# Patient Record
Sex: Female | Born: 1955 | Race: White | Hispanic: No | Marital: Married | Smoking: Never smoker
Health system: Southern US, Community
[De-identification: ages and names within clinical notes are randomized; demographics above are authoritative.]

## PROBLEM LIST (undated history)

## (undated) DIAGNOSIS — G8929 Other chronic pain: Secondary | ICD-10-CM

## (undated) DIAGNOSIS — R51 Headache: Secondary | ICD-10-CM

## (undated) DIAGNOSIS — K219 Gastro-esophageal reflux disease without esophagitis: Secondary | ICD-10-CM

## (undated) DIAGNOSIS — G629 Polyneuropathy, unspecified: Secondary | ICD-10-CM

## (undated) DIAGNOSIS — E559 Vitamin D deficiency, unspecified: Secondary | ICD-10-CM

## (undated) DIAGNOSIS — F329 Major depressive disorder, single episode, unspecified: Secondary | ICD-10-CM

## (undated) DIAGNOSIS — J302 Other seasonal allergic rhinitis: Secondary | ICD-10-CM

## (undated) DIAGNOSIS — Z8709 Personal history of other diseases of the respiratory system: Secondary | ICD-10-CM

## (undated) DIAGNOSIS — R112 Nausea with vomiting, unspecified: Secondary | ICD-10-CM

## (undated) DIAGNOSIS — M2550 Pain in unspecified joint: Secondary | ICD-10-CM

## (undated) DIAGNOSIS — I10 Essential (primary) hypertension: Secondary | ICD-10-CM

## (undated) DIAGNOSIS — Z8669 Personal history of other diseases of the nervous system and sense organs: Secondary | ICD-10-CM

## (undated) DIAGNOSIS — Z8619 Personal history of other infectious and parasitic diseases: Secondary | ICD-10-CM

## (undated) DIAGNOSIS — K259 Gastric ulcer, unspecified as acute or chronic, without hemorrhage or perforation: Secondary | ICD-10-CM

## (undated) DIAGNOSIS — Q438 Other specified congenital malformations of intestine: Secondary | ICD-10-CM

## (undated) DIAGNOSIS — M549 Dorsalgia, unspecified: Secondary | ICD-10-CM

## (undated) DIAGNOSIS — G4700 Insomnia, unspecified: Secondary | ICD-10-CM

## (undated) DIAGNOSIS — Z87440 Personal history of urinary (tract) infections: Secondary | ICD-10-CM

## (undated) DIAGNOSIS — M2540 Effusion, unspecified joint: Secondary | ICD-10-CM

## (undated) DIAGNOSIS — K5790 Diverticulosis of intestine, part unspecified, without perforation or abscess without bleeding: Secondary | ICD-10-CM

## (undated) DIAGNOSIS — Z9889 Other specified postprocedural states: Secondary | ICD-10-CM

## (undated) DIAGNOSIS — F32A Depression, unspecified: Secondary | ICD-10-CM

## (undated) DIAGNOSIS — T300 Burn of unspecified body region, unspecified degree: Secondary | ICD-10-CM

## (undated) DIAGNOSIS — E785 Hyperlipidemia, unspecified: Secondary | ICD-10-CM

## (undated) DIAGNOSIS — M1990 Unspecified osteoarthritis, unspecified site: Secondary | ICD-10-CM

## (undated) DIAGNOSIS — K5900 Constipation, unspecified: Secondary | ICD-10-CM

## (undated) DIAGNOSIS — J45909 Unspecified asthma, uncomplicated: Secondary | ICD-10-CM

## (undated) DIAGNOSIS — J189 Pneumonia, unspecified organism: Secondary | ICD-10-CM

## (undated) DIAGNOSIS — M8580 Other specified disorders of bone density and structure, unspecified site: Secondary | ICD-10-CM

## (undated) HISTORY — PX: OTHER SURGICAL HISTORY: SHX169

## (undated) HISTORY — PX: ESOPHAGOGASTRODUODENOSCOPY: SHX1529

## (undated) HISTORY — PX: COLONOSCOPY: SHX174

---

## 1996-05-03 HISTORY — PX: ABDOMINAL HYSTERECTOMY: SHX81

## 1999-10-21 ENCOUNTER — Encounter: Payer: Self-pay | Admitting: Family Medicine

## 1999-10-21 ENCOUNTER — Ambulatory Visit (HOSPITAL_COMMUNITY): Admission: RE | Admit: 1999-10-21 | Discharge: 1999-10-21 | Payer: Self-pay | Admitting: Family Medicine

## 2000-08-18 ENCOUNTER — Encounter: Admission: RE | Admit: 2000-08-18 | Discharge: 2000-08-18 | Payer: Self-pay | Admitting: Family Medicine

## 2000-08-18 ENCOUNTER — Encounter: Payer: Self-pay | Admitting: Family Medicine

## 2001-09-13 ENCOUNTER — Encounter: Payer: Self-pay | Admitting: Family Medicine

## 2001-09-13 ENCOUNTER — Ambulatory Visit (HOSPITAL_COMMUNITY): Admission: RE | Admit: 2001-09-13 | Discharge: 2001-09-13 | Payer: Self-pay | Admitting: Family Medicine

## 2003-02-17 ENCOUNTER — Emergency Department (HOSPITAL_COMMUNITY): Admission: EM | Admit: 2003-02-17 | Discharge: 2003-02-17 | Payer: Self-pay | Admitting: Emergency Medicine

## 2003-02-17 ENCOUNTER — Encounter: Payer: Self-pay | Admitting: Emergency Medicine

## 2003-08-28 ENCOUNTER — Ambulatory Visit (HOSPITAL_COMMUNITY): Admission: RE | Admit: 2003-08-28 | Discharge: 2003-08-28 | Payer: Self-pay | Admitting: Family Medicine

## 2004-03-16 ENCOUNTER — Ambulatory Visit: Payer: Self-pay | Admitting: Family Medicine

## 2004-10-13 ENCOUNTER — Ambulatory Visit (HOSPITAL_COMMUNITY): Admission: RE | Admit: 2004-10-13 | Discharge: 2004-10-13 | Payer: Self-pay | Admitting: Internal Medicine

## 2005-01-19 ENCOUNTER — Ambulatory Visit: Payer: Self-pay | Admitting: Family Medicine

## 2005-03-04 ENCOUNTER — Ambulatory Visit: Payer: Self-pay | Admitting: Family Medicine

## 2005-03-10 ENCOUNTER — Ambulatory Visit: Payer: Self-pay

## 2005-03-11 ENCOUNTER — Ambulatory Visit: Payer: Self-pay

## 2005-03-22 ENCOUNTER — Ambulatory Visit: Payer: Self-pay | Admitting: Family Medicine

## 2005-04-12 ENCOUNTER — Ambulatory Visit: Payer: Self-pay | Admitting: Family Medicine

## 2005-04-12 ENCOUNTER — Other Ambulatory Visit: Admission: RE | Admit: 2005-04-12 | Discharge: 2005-04-12 | Payer: Self-pay | Admitting: Family Medicine

## 2005-04-12 ENCOUNTER — Encounter: Payer: Self-pay | Admitting: Family Medicine

## 2005-04-19 ENCOUNTER — Ambulatory Visit: Payer: Self-pay | Admitting: Gastroenterology

## 2005-05-17 ENCOUNTER — Encounter: Admission: RE | Admit: 2005-05-17 | Discharge: 2005-08-15 | Payer: Self-pay | Admitting: Family Medicine

## 2005-05-27 ENCOUNTER — Ambulatory Visit: Payer: Self-pay | Admitting: Gastroenterology

## 2005-05-27 ENCOUNTER — Ambulatory Visit: Payer: Self-pay | Admitting: Family Medicine

## 2005-06-04 ENCOUNTER — Ambulatory Visit: Payer: Self-pay | Admitting: Gastroenterology

## 2005-06-14 ENCOUNTER — Ambulatory Visit (HOSPITAL_COMMUNITY): Admission: RE | Admit: 2005-06-14 | Discharge: 2005-06-14 | Payer: Self-pay | Admitting: Gastroenterology

## 2005-07-30 ENCOUNTER — Ambulatory Visit: Payer: Self-pay | Admitting: Family Medicine

## 2005-08-12 ENCOUNTER — Ambulatory Visit: Payer: Self-pay | Admitting: Family Medicine

## 2005-10-15 ENCOUNTER — Ambulatory Visit (HOSPITAL_COMMUNITY): Admission: RE | Admit: 2005-10-15 | Discharge: 2005-10-15 | Payer: Self-pay | Admitting: Family Medicine

## 2006-10-27 IMAGING — RF DG BE W/ CM - WO/W KUB
19 of 24 series · 19 of 24 positions shown · non-contrast
Comparison: none

CLINICAL DATA: Patient had incomplete colonoscopy.  Patient has had constipation.
 BARIUM ENEMA:

[Series 1: run · 1 of 1 slices shown (1 of 19)]
[im 1/1]
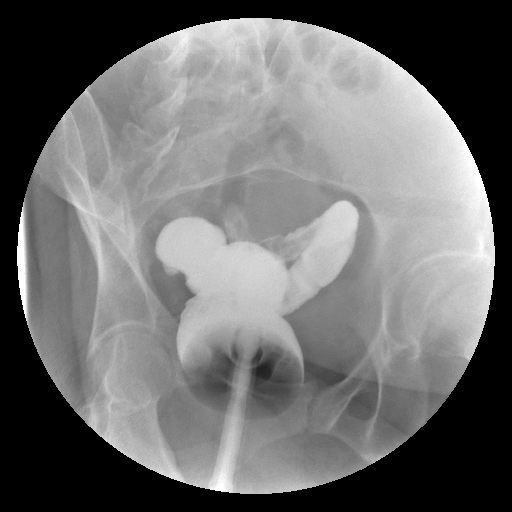

[Series 2: run · 1 of 1 slices shown (2 of 19)]
[im 1/1]
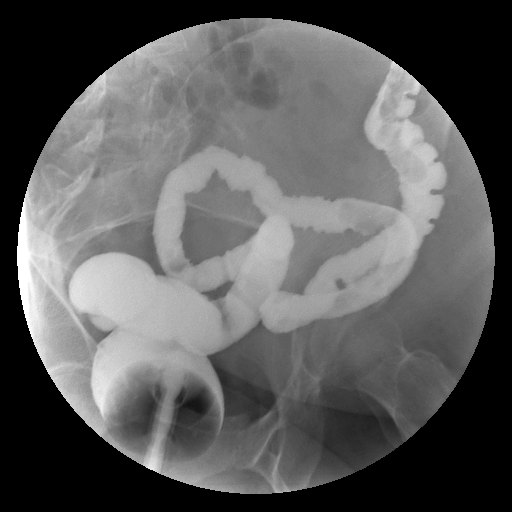

[Series 4: run · 1 of 1 slices shown (3 of 19)]
[im 1/1]
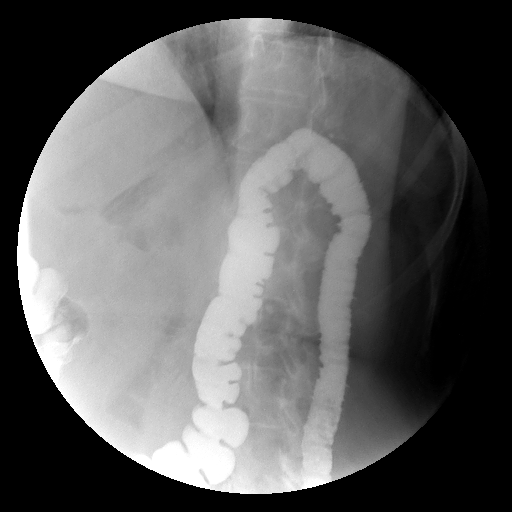

[Series 5: run · 1 of 1 slices shown (4 of 19)]
[im 1/1]
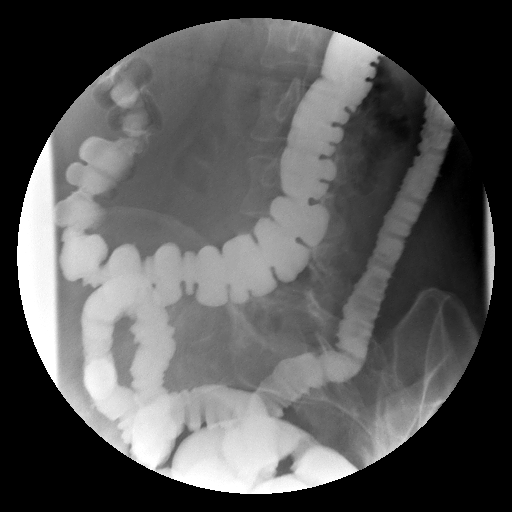

[Series 6: run · 1 of 1 slices shown (5 of 19)]
[im 1/1]
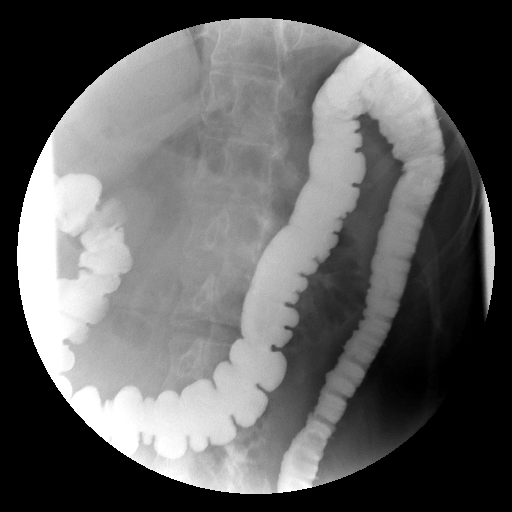

[Series 7: run · 1 of 1 slices shown (6 of 19)]
[im 1/1]
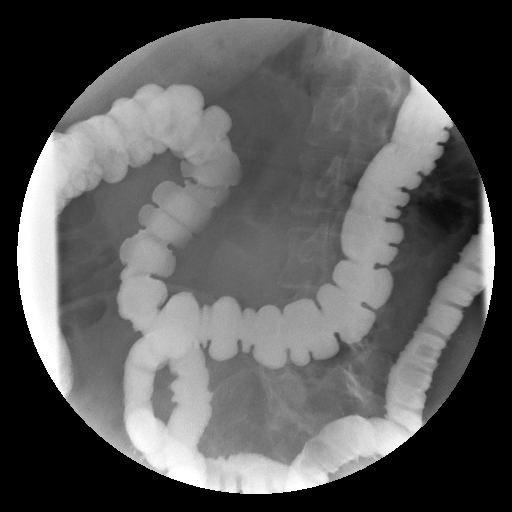

[Series 9: run · 1 of 1 slices shown (7 of 19)]
[im 1/1]
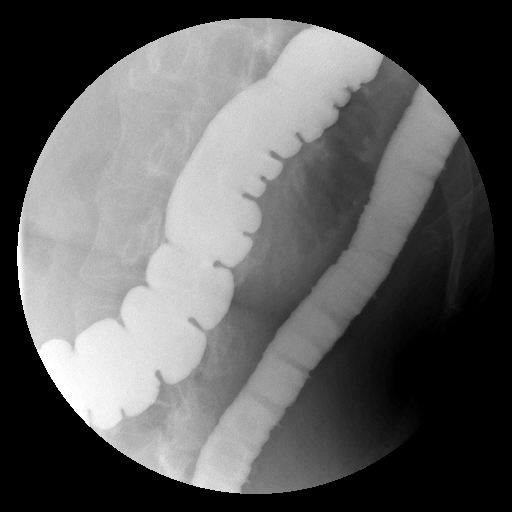

[Series 10: run · 1 of 1 slices shown (8 of 19)]
[im 1/1]
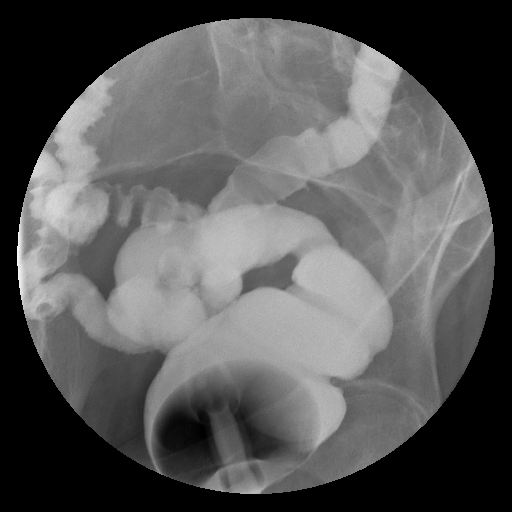

[Series 11: run · 1 of 1 slices shown (9 of 19)]
[im 1/1]
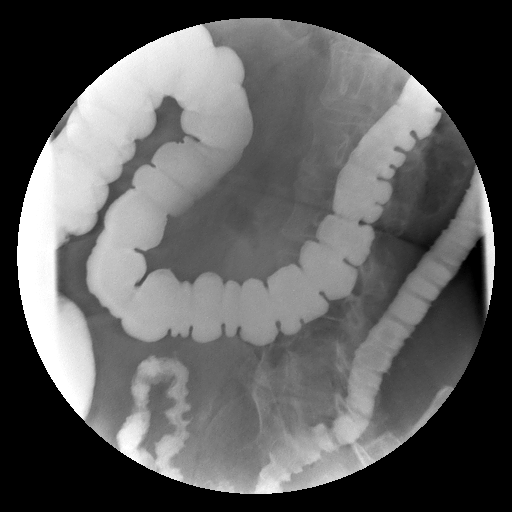

[Series 13: run · 1 of 1 slices shown (10 of 19)]
[im 1/1]
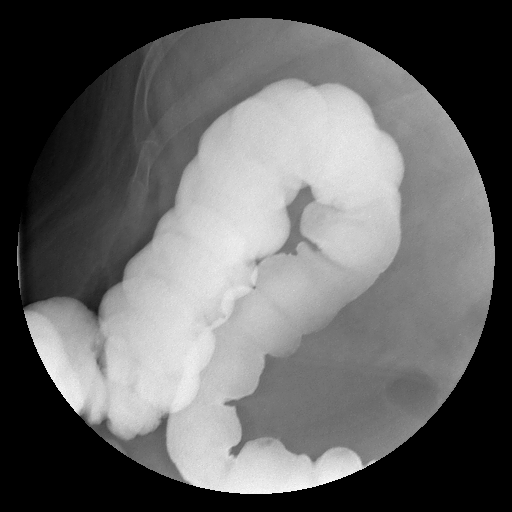

[Series 14: run · 1 of 1 slices shown (11 of 19)]
[im 1/1]
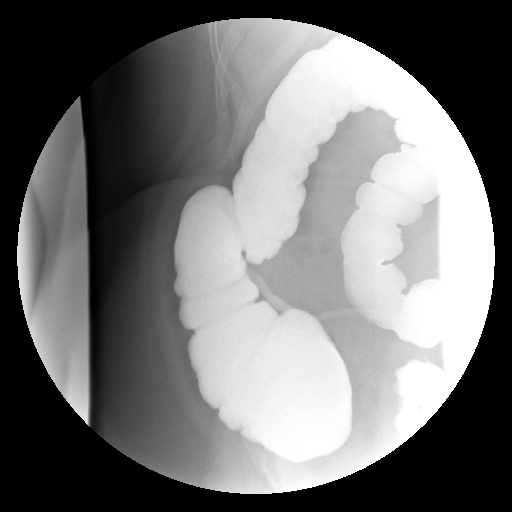

[Series 15: run · 1 of 1 slices shown (12 of 19)]
[im 1/1]
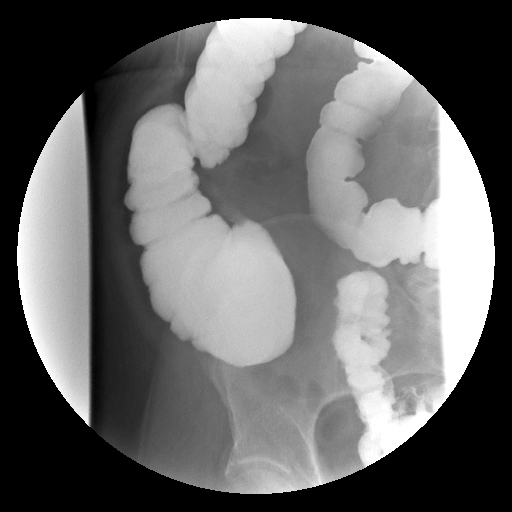

[Series 16: run · 1 of 1 slices shown (13 of 19)]
[im 1/1]
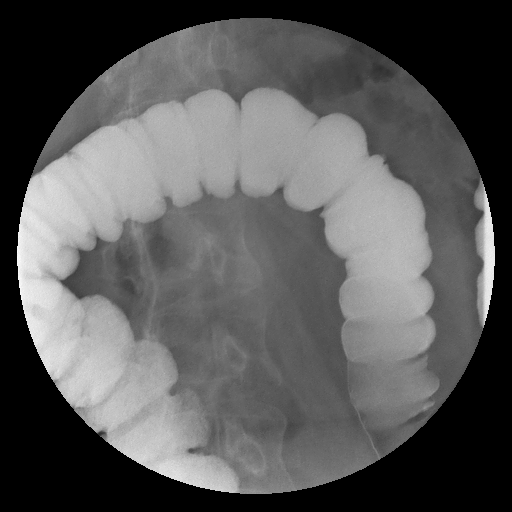

[Series 18: run · 1 of 1 slices shown (14 of 19)]
[im 1/1]
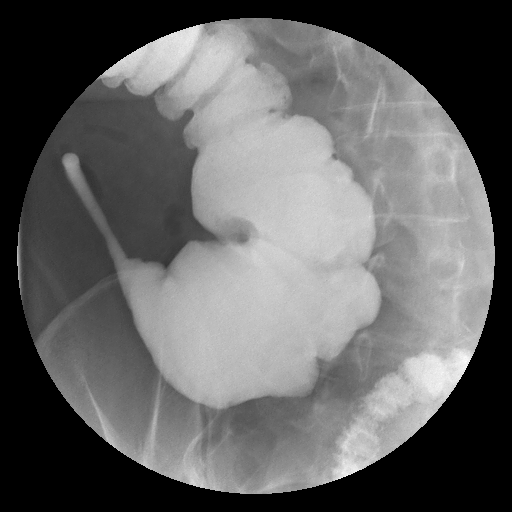

[Series 19: run · 1 of 1 slices shown (15 of 19)]
[im 1/1]
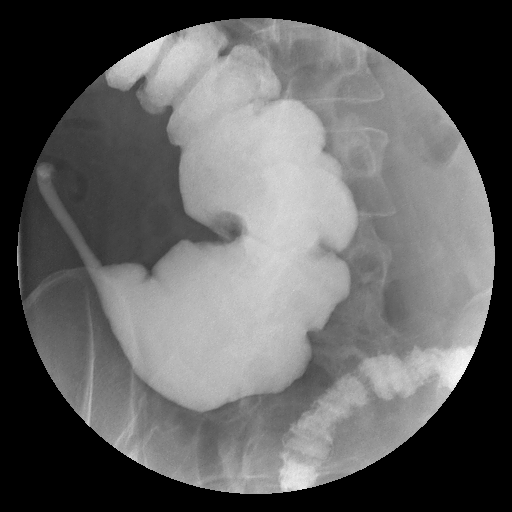

[Series 20: run · 1 of 1 slices shown (16 of 19)]
[im 1/1]
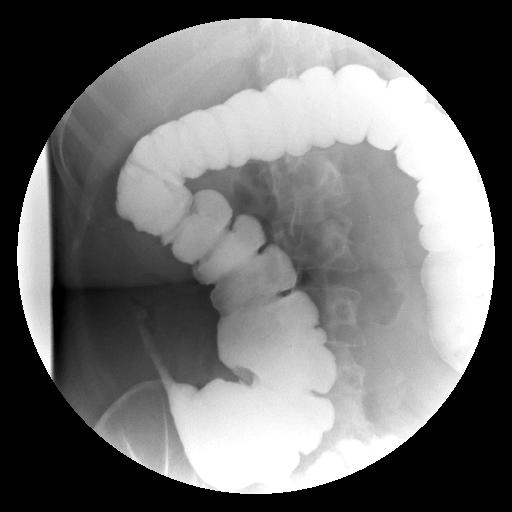

[Series 21: run · 1 of 1 slices shown (17 of 19)]
[im 1/1]
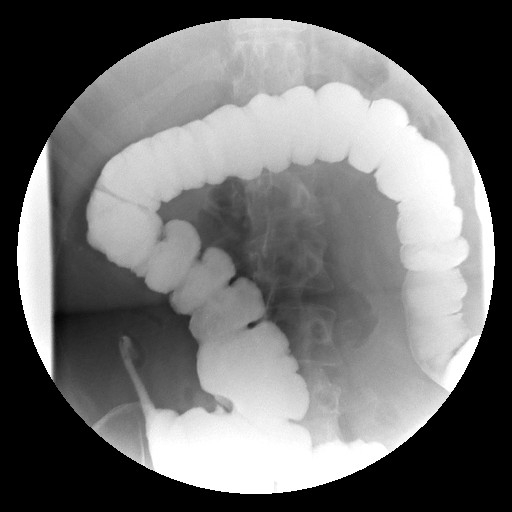

[Series 23: run · 1 of 1 slices shown (18 of 19)]
[im 1/1]
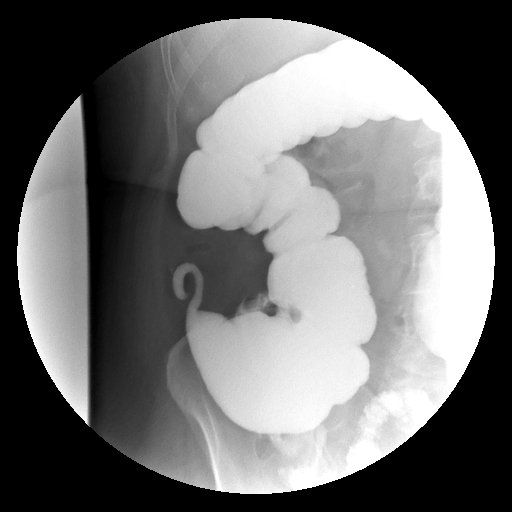

[Series 24: run · 1 of 1 slices shown (19 of 19)]
[im 1/1]
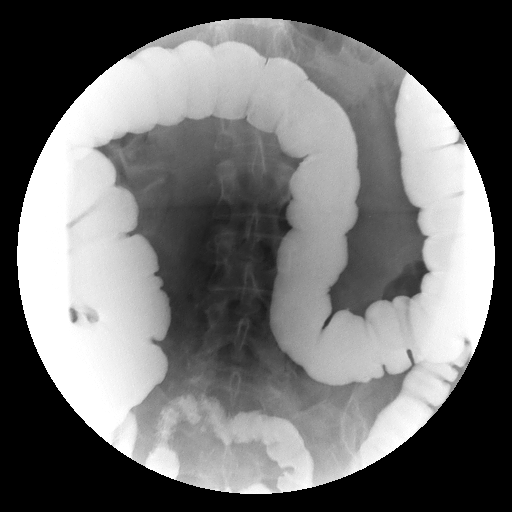

[19 of 24 positions shown; findings below may reference images not displayed]

FINDINGS: Preliminary KUB is unremarkable.  
 No obstruction is noted to the retrograde flow of barium.  Cecum is markedly tortuous as well as tortuosity in the region of the proximal and right colon.  There is a prominent mucosal fold noted in the region of the distal right colon in the area of the hepatic flexure.  This persists with the patient in the right posterior oblique as well as the left posterior position.  No definite mass is identified.  The cecum is well visualized with the cecum being malrotated with the ileocecal valve being laterally positioned.  Appendix is lateral in position and the tip of the appendix extends to the region of the liver.  There is noted to be retained feces right at the proximal right colon.
IMPRESSION: There is evidence of mobile cecum with ileocecal valve being positioned laterally and a type of retrocecal appendix.
 No abnormality noted, however, there is a prominent mucosal fold in the distal portion of the right colon, approximately at the hepatic flexure.  This mucosal fold is persistent and is not felt to represent a definite abnormality.

## 2007-08-15 ENCOUNTER — Ambulatory Visit (HOSPITAL_COMMUNITY): Admission: RE | Admit: 2007-08-15 | Discharge: 2007-08-15 | Payer: Self-pay | Admitting: Family Medicine

## 2008-05-28 ENCOUNTER — Encounter: Admission: RE | Admit: 2008-05-28 | Discharge: 2008-08-26 | Payer: Self-pay | Admitting: Psychiatry

## 2008-08-05 ENCOUNTER — Encounter: Admission: RE | Admit: 2008-08-05 | Discharge: 2008-08-13 | Payer: Self-pay | Admitting: Family Medicine

## 2008-08-15 ENCOUNTER — Ambulatory Visit (HOSPITAL_COMMUNITY): Admission: RE | Admit: 2008-08-15 | Discharge: 2008-08-15 | Payer: Self-pay | Admitting: Family Medicine

## 2010-03-22 ENCOUNTER — Emergency Department (HOSPITAL_BASED_OUTPATIENT_CLINIC_OR_DEPARTMENT_OTHER): Admission: EM | Admit: 2010-03-22 | Discharge: 2010-03-22 | Payer: Self-pay | Admitting: Emergency Medicine

## 2010-05-03 HISTORY — PX: OTHER SURGICAL HISTORY: SHX169

## 2010-06-11 ENCOUNTER — Other Ambulatory Visit: Payer: Self-pay | Admitting: Orthopedic Surgery

## 2010-06-11 DIAGNOSIS — M25511 Pain in right shoulder: Secondary | ICD-10-CM

## 2010-06-12 ENCOUNTER — Ambulatory Visit
Admission: RE | Admit: 2010-06-12 | Discharge: 2010-06-12 | Disposition: A | Discharge status: Home / Self Care | Payer: BC Managed Care – PPO | Source: Ambulatory Visit | Attending: Orthopedic Surgery | Admitting: Orthopedic Surgery

## 2010-06-12 DIAGNOSIS — M25511 Pain in right shoulder: Secondary | ICD-10-CM

## 2010-10-10 ENCOUNTER — Encounter: Payer: Self-pay | Admitting: Gastroenterology

## 2011-05-04 HISTORY — PX: OTHER SURGICAL HISTORY: SHX169

## 2011-08-06 ENCOUNTER — Other Ambulatory Visit: Payer: Self-pay | Admitting: Orthopaedic Surgery

## 2011-08-06 DIAGNOSIS — M25511 Pain in right shoulder: Secondary | ICD-10-CM

## 2011-08-09 ENCOUNTER — Ambulatory Visit
Admission: RE | Admit: 2011-08-09 | Discharge: 2011-08-09 | Disposition: A | Discharge status: Home / Self Care | Payer: BC Managed Care – PPO | Source: Ambulatory Visit | Attending: Orthopaedic Surgery | Admitting: Orthopaedic Surgery

## 2011-08-09 DIAGNOSIS — M25511 Pain in right shoulder: Secondary | ICD-10-CM

## 2011-08-09 MED ORDER — IOHEXOL 180 MG/ML  SOLN
15.0000 mL | Freq: Once | INTRAMUSCULAR | Status: AC | PRN
  Administered 2011-08-09: 15 mL via INTRA_ARTICULAR

## 2012-01-14 ENCOUNTER — Encounter (HOSPITAL_COMMUNITY): Payer: Self-pay | Admitting: Pharmacy Technician

## 2012-01-17 ENCOUNTER — Encounter (HOSPITAL_COMMUNITY)
Admission: RE | Admit: 2012-01-17 | Discharge: 2012-01-17 | Disposition: A | Discharge status: Home / Self Care | Payer: BC Managed Care – PPO | Source: Ambulatory Visit | Attending: Orthopedic Surgery | Admitting: Orthopedic Surgery

## 2012-01-17 ENCOUNTER — Encounter: Payer: Self-pay | Admitting: Gastroenterology

## 2012-01-17 ENCOUNTER — Ambulatory Visit (HOSPITAL_COMMUNITY)
Admission: RE | Admit: 2012-01-17 | Discharge: 2012-01-17 | Disposition: A | Discharge status: Home / Self Care | Payer: BC Managed Care – PPO | Source: Ambulatory Visit | Attending: Orthopedic Surgery | Admitting: Orthopedic Surgery

## 2012-01-17 ENCOUNTER — Ambulatory Visit
Admission: RE | Admit: 2012-01-17 | Discharge: 2012-01-17 | Disposition: A | Discharge status: Home / Self Care | Payer: BC Managed Care – PPO | Source: Ambulatory Visit | Attending: Orthopedic Surgery | Admitting: Orthopedic Surgery

## 2012-01-17 ENCOUNTER — Other Ambulatory Visit: Payer: Self-pay | Admitting: Orthopedic Surgery

## 2012-01-17 ENCOUNTER — Encounter (HOSPITAL_COMMUNITY): Payer: Self-pay

## 2012-01-17 ENCOUNTER — Other Ambulatory Visit (HOSPITAL_COMMUNITY): Payer: Self-pay | Admitting: Orthopedic Surgery

## 2012-01-17 DIAGNOSIS — Z01818 Encounter for other preprocedural examination: Secondary | ICD-10-CM | POA: Insufficient documentation

## 2012-01-17 DIAGNOSIS — Z01812 Encounter for preprocedural laboratory examination: Secondary | ICD-10-CM | POA: Insufficient documentation

## 2012-01-17 DIAGNOSIS — M25539 Pain in unspecified wrist: Secondary | ICD-10-CM

## 2012-01-17 DIAGNOSIS — Z01810 Encounter for preprocedural cardiovascular examination: Secondary | ICD-10-CM | POA: Insufficient documentation

## 2012-01-17 DIAGNOSIS — S62109A Fracture of unspecified carpal bone, unspecified wrist, initial encounter for closed fracture: Secondary | ICD-10-CM | POA: Insufficient documentation

## 2012-01-17 DIAGNOSIS — X58XXXA Exposure to other specified factors, initial encounter: Secondary | ICD-10-CM | POA: Insufficient documentation

## 2012-01-17 HISTORY — DX: Other seasonal allergic rhinitis: J30.2

## 2012-01-17 HISTORY — DX: Personal history of other diseases of the nervous system and sense organs: Z86.69

## 2012-01-17 HISTORY — DX: Pneumonia, unspecified organism: J18.9

## 2012-01-17 HISTORY — DX: Personal history of other diseases of the respiratory system: Z87.09

## 2012-01-17 HISTORY — DX: Constipation, unspecified: K59.00

## 2012-01-17 HISTORY — DX: Other specified congenital malformations of intestine: Q43.8

## 2012-01-17 HISTORY — DX: Unspecified osteoarthritis, unspecified site: M19.90

## 2012-01-17 HISTORY — DX: Effusion, unspecified joint: M25.40

## 2012-01-17 HISTORY — DX: Diverticulosis of intestine, part unspecified, without perforation or abscess without bleeding: K57.90

## 2012-01-17 HISTORY — DX: Burn of unspecified body region, unspecified degree: T30.0

## 2012-01-17 HISTORY — DX: Other chronic pain: G89.29

## 2012-01-17 HISTORY — DX: Nausea with vomiting, unspecified: R11.2

## 2012-01-17 HISTORY — DX: Hyperlipidemia, unspecified: E78.5

## 2012-01-17 HISTORY — DX: Other specified disorders of bone density and structure, unspecified site: M85.80

## 2012-01-17 HISTORY — DX: Personal history of urinary (tract) infections: Z87.440

## 2012-01-17 HISTORY — DX: Personal history of other infectious and parasitic diseases: Z86.19

## 2012-01-17 HISTORY — DX: Polyneuropathy, unspecified: G62.9

## 2012-01-17 HISTORY — DX: Gastro-esophageal reflux disease without esophagitis: K21.9

## 2012-01-17 HISTORY — DX: Unspecified asthma, uncomplicated: J45.909

## 2012-01-17 HISTORY — DX: Dorsalgia, unspecified: M54.9

## 2012-01-17 HISTORY — DX: Pain in unspecified joint: M25.50

## 2012-01-17 HISTORY — DX: Gastric ulcer, unspecified as acute or chronic, without hemorrhage or perforation: K25.9

## 2012-01-17 HISTORY — DX: Insomnia, unspecified: G47.00

## 2012-01-17 HISTORY — DX: Other specified postprocedural states: Z98.890

## 2012-01-17 HISTORY — DX: Vitamin D deficiency, unspecified: E55.9

## 2012-01-17 HISTORY — DX: Essential (primary) hypertension: I10

## 2012-01-17 LAB — BASIC METABOLIC PANEL
BUN: 13 mg/dL (ref 6–23)
CO2: 27 mEq/L (ref 19–32)
Chloride: 104 mEq/L (ref 96–112)
GFR calc Af Amer: >90 mL/min (ref >90)
GFR calc non Af Amer: >90 mL/min (ref >90)
Glucose, Bld: 103 mg/dL — ABNORMAL HIGH (ref 70–99)

## 2012-01-17 LAB — CBC
HCT: 31.2 % — ABNORMAL LOW (ref 36.0–46.0)
MCV: 84.3 fL (ref 78.0–100.0)
Platelets: 332 10*3/uL (ref 150–400)
RBC: 3.7 MIL/uL — ABNORMAL LOW (ref 3.87–5.11)

## 2012-01-17 NOTE — Pre-Procedure Instructions (Signed)
20 Tara [name]  01/17/2012   Your procedure is scheduled on:  Tues, Sept 17 @ 12:30 PM  Report to [facility] at 10:30 AM.  Call this number if you have problems the morning of surgery: [phone]   Remember:   Do not eat food:After Midnight.  Take these medicines the morning of surgery with A SIP OF WATER: Pain Pill(if needed) and Omeprazole(Prilosec)   Do not wear jewelry, make-up or nail polish.  Do not wear lotions, powders, or perfumes. You may wear deodorant.  Do not shave 48 hours prior to surgery.   Do not bring valuables to the hospital.  Contacts, dentures or bridgework may not be worn into surgery.  Leave suitcase in the car. After surgery it may be brought to your room.  For patients admitted to the hospital, checkout time is 11:00 AM the day of discharge.   Patients discharged the day of surgery will not be allowed to drive home.    Special Instructions: CHG Shower Use Special Wash: 1/2 bottle night before surgery and 1/2 bottle morning of surgery.   Please read over the following fact sheets that you were given: Pain Booklet, Coughing and Deep Breathing, MRSA Information and Surgical Site Infection Prevention

## 2012-01-17 NOTE — Progress Notes (Signed)
Requested orders from [place] @ Dr.Dean's office;orders will be put into epic after pt sees doctor @ 3pm
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
## 2012-01-17 NOTE — Progress Notes (Signed)
NOTIFIED DR Sampson Goon OF HGB 9.8, NO NEW ORDERS GIVEN.

## 2012-01-17 NOTE — Progress Notes (Signed)
Pt doesn't have a  Cardiologist  Echo/Stress test done in 2002  Denies ever having a heart cath  Dr.Grady Larina Bras is Medical MD [phone]

## 2012-01-18 ENCOUNTER — Other Ambulatory Visit (HOSPITAL_COMMUNITY): Payer: Self-pay

## 2012-01-18 ENCOUNTER — Inpatient Hospital Stay (HOSPITAL_COMMUNITY): Payer: Worker's Compensation

## 2012-01-18 ENCOUNTER — Encounter (HOSPITAL_COMMUNITY): Payer: Self-pay | Admitting: *Deleted

## 2012-01-18 ENCOUNTER — Encounter (HOSPITAL_COMMUNITY)
Admission: RE | Disposition: A | Discharge status: Home / Self Care | Payer: Self-pay | Source: Ambulatory Visit | Attending: Orthopedic Surgery

## 2012-01-18 ENCOUNTER — Ambulatory Visit (HOSPITAL_COMMUNITY): Payer: Worker's Compensation | Admitting: *Deleted

## 2012-01-18 ENCOUNTER — Inpatient Hospital Stay (HOSPITAL_COMMUNITY)
Admission: RE | Admit: 2012-01-18 | Discharge: 2012-01-19 | DRG: 511 | Disposition: A | Discharge status: Home / Self Care | Payer: Worker's Compensation | Source: Ambulatory Visit | Attending: Orthopedic Surgery | Admitting: Orthopedic Surgery

## 2012-01-18 ENCOUNTER — Other Ambulatory Visit (HOSPITAL_COMMUNITY): Payer: Self-pay | Admitting: Orthopedic Surgery

## 2012-01-18 DIAGNOSIS — G4700 Insomnia, unspecified: Secondary | ICD-10-CM | POA: Diagnosis present

## 2012-01-18 DIAGNOSIS — K219 Gastro-esophageal reflux disease without esophagitis: Secondary | ICD-10-CM | POA: Diagnosis present

## 2012-01-18 DIAGNOSIS — S52599A Other fractures of lower end of unspecified radius, initial encounter for closed fracture: Principal | ICD-10-CM | POA: Diagnosis present

## 2012-01-18 DIAGNOSIS — M129 Arthropathy, unspecified: Secondary | ICD-10-CM | POA: Diagnosis present

## 2012-01-18 DIAGNOSIS — E119 Type 2 diabetes mellitus without complications: Secondary | ICD-10-CM | POA: Diagnosis present

## 2012-01-18 DIAGNOSIS — M549 Dorsalgia, unspecified: Secondary | ICD-10-CM | POA: Diagnosis present

## 2012-01-18 DIAGNOSIS — Z90710 Acquired absence of both cervix and uterus: Secondary | ICD-10-CM

## 2012-01-18 DIAGNOSIS — K5900 Constipation, unspecified: Secondary | ICD-10-CM | POA: Diagnosis present

## 2012-01-18 DIAGNOSIS — G609 Hereditary and idiopathic neuropathy, unspecified: Secondary | ICD-10-CM | POA: Diagnosis present

## 2012-01-18 DIAGNOSIS — Z881 Allergy status to other antibiotic agents status: Secondary | ICD-10-CM

## 2012-01-18 DIAGNOSIS — G8929 Other chronic pain: Secondary | ICD-10-CM | POA: Diagnosis present

## 2012-01-18 DIAGNOSIS — J45909 Unspecified asthma, uncomplicated: Secondary | ICD-10-CM | POA: Diagnosis present

## 2012-01-18 DIAGNOSIS — E559 Vitamin D deficiency, unspecified: Secondary | ICD-10-CM | POA: Diagnosis present

## 2012-01-18 DIAGNOSIS — S42209A Unspecified fracture of upper end of unspecified humerus, initial encounter for closed fracture: Secondary | ICD-10-CM | POA: Diagnosis present

## 2012-01-18 DIAGNOSIS — E785 Hyperlipidemia, unspecified: Secondary | ICD-10-CM | POA: Diagnosis present

## 2012-01-18 DIAGNOSIS — Z880 Allergy status to penicillin: Secondary | ICD-10-CM

## 2012-01-18 DIAGNOSIS — M948X9 Other specified disorders of cartilage, unspecified sites: Secondary | ICD-10-CM | POA: Diagnosis present

## 2012-01-18 DIAGNOSIS — I10 Essential (primary) hypertension: Secondary | ICD-10-CM | POA: Diagnosis present

## 2012-01-18 DIAGNOSIS — S62109A Fracture of unspecified carpal bone, unspecified wrist, initial encounter for closed fracture: Secondary | ICD-10-CM

## 2012-01-18 HISTORY — PX: ORIF WRIST FRACTURE: SHX2133

## 2012-01-18 LAB — GLUCOSE, CAPILLARY: Glucose-Capillary: 141 mg/dL — ABNORMAL HIGH (ref 70–99)

## 2012-01-18 SURGERY — OPEN REDUCTION INTERNAL FIXATION (ORIF) WRIST FRACTURE
Anesthesia: General | Site: Wrist | Laterality: Right | Wound class: Clean

## 2012-01-18 MED ORDER — HYDROMORPHONE HCL PF 1 MG/ML IJ SOLN
INTRAMUSCULAR | Status: AC
  Filled 2012-01-18: qty 1

## 2012-01-18 MED ORDER — METHOCARBAMOL 500 MG PO TABS
500.0000 mg | ORAL_TABLET | Freq: Four times a day (QID) | ORAL | Status: DC | PRN
  Administered 2012-01-18 – 2012-01-19 (×2): 500 mg via ORAL
  Filled 2012-01-18 (×3): qty 1

## 2012-01-18 MED ORDER — NEOSTIGMINE METHYLSULFATE 1 MG/ML IJ SOLN
INTRAMUSCULAR | Status: DC | PRN
  Administered 2012-01-18: 4 mg via INTRAVENOUS

## 2012-01-18 MED ORDER — ACETAMINOPHEN 10 MG/ML IV SOLN
INTRAVENOUS | Status: AC
  Filled 2012-01-18: qty 100

## 2012-01-18 MED ORDER — CALCIUM CARBONATE 1250 (500 CA) MG PO TABS
1.0000 | ORAL_TABLET | Freq: Two times a day (BID) | ORAL | Status: DC
  Administered 2012-01-19: 500 mg via ORAL
  Filled 2012-01-18 (×3): qty 1

## 2012-01-18 MED ORDER — METOCLOPRAMIDE HCL 5 MG/ML IJ SOLN: 5.0000 mg | Freq: Three times a day (TID) | INTRAMUSCULAR | Status: DC | PRN

## 2012-01-18 MED ORDER — FENTANYL CITRATE 0.05 MG/ML IJ SOLN: 50.0000 ug | INTRAMUSCULAR | Status: DC | PRN

## 2012-01-18 MED ORDER — METFORMIN HCL 500 MG PO TABS
500.0000 mg | ORAL_TABLET | Freq: Every day | ORAL | Status: DC
  Administered 2012-01-19: 500 mg via ORAL
  Filled 2012-01-18 (×2): qty 1

## 2012-01-18 MED ORDER — KETOROLAC TROMETHAMINE 30 MG/ML IJ SOLN: 15.0000 mg | Freq: Once | INTRAMUSCULAR | Status: DC | PRN

## 2012-01-18 MED ORDER — ONDANSETRON HCL 4 MG/2ML IJ SOLN
INTRAMUSCULAR | Status: DC | PRN
  Administered 2012-01-18: 4 mg via INTRAVENOUS

## 2012-01-18 MED ORDER — NIACIN ER 500 MG PO CPCR
500.0000 mg | ORAL_CAPSULE | Freq: Every day | ORAL | Status: DC
  Administered 2012-01-18: 500 mg via ORAL
  Filled 2012-01-18 (×2): qty 1

## 2012-01-18 MED ORDER — ONDANSETRON HCL 4 MG PO TABS
4.0000 mg | ORAL_TABLET | Freq: Four times a day (QID) | ORAL | Status: DC | PRN
  Filled 2012-01-18: qty 1

## 2012-01-18 MED ORDER — OXYCODONE HCL 5 MG PO TABS
5.0000 mg | ORAL_TABLET | ORAL | Status: DC | PRN
  Administered 2012-01-18 – 2012-01-19 (×5): 10 mg via ORAL
  Filled 2012-01-18 (×5): qty 2

## 2012-01-18 MED ORDER — CLINDAMYCIN PHOSPHATE 600 MG/50ML IV SOLN
600.0000 mg | Freq: Three times a day (TID) | INTRAVENOUS | Status: DC
  Administered 2012-01-18 – 2012-01-19 (×2): 600 mg via INTRAVENOUS
  Filled 2012-01-18 (×3): qty 50

## 2012-01-18 MED ORDER — PROPOFOL 10 MG/ML IV BOLUS
INTRAVENOUS | Status: DC | PRN
  Administered 2012-01-18: 200 mg via INTRAVENOUS

## 2012-01-18 MED ORDER — NIACIN-SIMVASTATIN ER 500-20 MG PO TB24
1.0000 | ORAL_TABLET | Freq: Every day | ORAL | Status: DC
Start: 2012-01-18 — End: 2012-01-18

## 2012-01-18 MED ORDER — LISINOPRIL-HYDROCHLOROTHIAZIDE 10-12.5 MG PO TABS
1.0000 | ORAL_TABLET | Freq: Every day | ORAL | Status: DC
Start: 2012-01-18 — End: 2012-01-18

## 2012-01-18 MED ORDER — METOCLOPRAMIDE HCL 5 MG PO TABS
5.0000 mg | ORAL_TABLET | Freq: Three times a day (TID) | ORAL | Status: DC | PRN
  Filled 2012-01-18: qty 2

## 2012-01-18 MED ORDER — SUCCINYLCHOLINE CHLORIDE 20 MG/ML IJ SOLN
INTRAMUSCULAR | Status: DC | PRN
  Administered 2012-01-18: 100 mg via INTRAVENOUS

## 2012-01-18 MED ORDER — HYDROMORPHONE HCL PF 1 MG/ML IJ SOLN
0.5000 mg | INTRAMUSCULAR | Status: DC | PRN
Start: 2012-01-18 — End: 2012-01-19
  Administered 2012-01-18 – 2012-01-19 (×4): 1 mg via INTRAVENOUS
  Filled 2012-01-18 (×4): qty 1

## 2012-01-18 MED ORDER — METHOCARBAMOL 100 MG/ML IJ SOLN
500.0000 mg | Freq: Four times a day (QID) | INTRAVENOUS | Status: DC | PRN
  Administered 2012-01-18: 500 mg via INTRAVENOUS
  Filled 2012-01-18 (×2): qty 5

## 2012-01-18 MED ORDER — HYDROMORPHONE HCL PF 1 MG/ML IJ SOLN
0.2500 mg | INTRAMUSCULAR | Status: DC | PRN
  Administered 2012-01-18 (×4): 0.5 mg via INTRAVENOUS

## 2012-01-18 MED ORDER — MIDAZOLAM HCL 2 MG/2ML IJ SOLN: 1.0000 mg | INTRAMUSCULAR | Status: DC | PRN

## 2012-01-18 MED ORDER — CLINDAMYCIN PHOSPHATE 900 MG/50ML IV SOLN
900.0000 mg | INTRAVENOUS | Status: AC
  Administered 2012-01-18: 900 mg via INTRAVENOUS
  Filled 2012-01-18: qty 50

## 2012-01-18 MED ORDER — INFLUENZA VIRUS VACC SPLIT PF IM SUSP
0.5000 mL | INTRAMUSCULAR | Status: AC
  Administered 2012-01-19: 0.5 mL via INTRAMUSCULAR
  Filled 2012-01-18: qty 0.5

## 2012-01-18 MED ORDER — LACTATED RINGERS IV SOLN
INTRAVENOUS | Status: DC | PRN
  Administered 2012-01-18 (×2): via INTRAVENOUS

## 2012-01-18 MED ORDER — OXYCODONE HCL 5 MG PO TABS
5.0000 mg | ORAL_TABLET | Freq: Once | ORAL | Status: AC | PRN
  Administered 2012-01-18: 5 mg via ORAL

## 2012-01-18 MED ORDER — PANTOPRAZOLE SODIUM 40 MG PO TBEC: 40.0000 mg | DELAYED_RELEASE_TABLET | Freq: Every day | ORAL | Status: DC

## 2012-01-18 MED ORDER — PROMETHAZINE HCL 25 MG/ML IJ SOLN: 6.2500 mg | INTRAMUSCULAR | Status: DC | PRN

## 2012-01-18 MED ORDER — VITAMIN D3 25 MCG (1000 UNIT) PO TABS
2000.0000 [IU] | ORAL_TABLET | Freq: Two times a day (BID) | ORAL | Status: DC
  Administered 2012-01-18 – 2012-01-19 (×2): 2000 [IU] via ORAL
  Filled 2012-01-18 (×3): qty 2

## 2012-01-18 MED ORDER — HYDROCHLOROTHIAZIDE 12.5 MG PO CAPS
12.5000 mg | ORAL_CAPSULE | Freq: Every day | ORAL | Status: DC
  Administered 2012-01-19: 12.5 mg via ORAL
  Filled 2012-01-18: qty 1

## 2012-01-18 MED ORDER — LACTATED RINGERS IV SOLN
INTRAVENOUS | Status: DC
  Administered 2012-01-18: 13:00:00 via INTRAVENOUS

## 2012-01-18 MED ORDER — MEPERIDINE HCL 25 MG/ML IJ SOLN: 6.2500 mg | INTRAMUSCULAR | Status: DC | PRN

## 2012-01-18 MED ORDER — ACETAMINOPHEN 10 MG/ML IV SOLN
INTRAVENOUS | Status: DC | PRN
  Administered 2012-01-18: 1000 mg via INTRAVENOUS

## 2012-01-18 MED ORDER — ROCURONIUM BROMIDE 100 MG/10ML IV SOLN
INTRAVENOUS | Status: DC | PRN
  Administered 2012-01-18: 50 mg via INTRAVENOUS

## 2012-01-18 MED ORDER — ADULT MULTIVITAMIN W/MINERALS CH
1.0000 | ORAL_TABLET | Freq: Every day | ORAL | Status: DC
  Administered 2012-01-19: 1 via ORAL
  Filled 2012-01-18: qty 1

## 2012-01-18 MED ORDER — ASPIRIN 325 MG PO TABS
325.0000 mg | ORAL_TABLET | Freq: Every day | ORAL | Status: DC
  Administered 2012-01-19: 325 mg via ORAL
  Filled 2012-01-18: qty 1

## 2012-01-18 MED ORDER — GLYCOPYRROLATE 0.2 MG/ML IJ SOLN
INTRAMUSCULAR | Status: DC | PRN
  Administered 2012-01-18: .6 mg via INTRAVENOUS

## 2012-01-18 MED ORDER — VECURONIUM BROMIDE 10 MG IV SOLR
INTRAVENOUS | Status: DC | PRN
  Administered 2012-01-18: 1 mg via INTRAVENOUS

## 2012-01-18 MED ORDER — POTASSIUM CHLORIDE IN NACL 20-0.9 MEQ/L-% IV SOLN
INTRAVENOUS | Status: DC
  Filled 2012-01-18 (×3): qty 1000

## 2012-01-18 MED ORDER — FENOFIBRATE 54 MG PO TABS
54.0000 mg | ORAL_TABLET | Freq: Every day | ORAL | Status: DC
  Administered 2012-01-19: 54 mg via ORAL
  Filled 2012-01-18: qty 1

## 2012-01-18 MED ORDER — ONDANSETRON HCL 4 MG/2ML IJ SOLN
4.0000 mg | Freq: Four times a day (QID) | INTRAMUSCULAR | Status: DC | PRN
  Filled 2012-01-18: qty 2

## 2012-01-18 MED ORDER — OXYCODONE HCL 5 MG PO TABS
ORAL_TABLET | ORAL | Status: AC
  Filled 2012-01-18: qty 1

## 2012-01-18 MED ORDER — FENTANYL CITRATE 0.05 MG/ML IJ SOLN
INTRAMUSCULAR | Status: DC | PRN
  Administered 2012-01-18 (×5): 50 ug via INTRAVENOUS
  Administered 2012-01-18: 100 ug via INTRAVENOUS
  Administered 2012-01-18 (×3): 50 ug via INTRAVENOUS

## 2012-01-18 MED ORDER — MIDAZOLAM HCL 5 MG/5ML IJ SOLN
INTRAMUSCULAR | Status: DC | PRN
  Administered 2012-01-18: 2 mg via INTRAVENOUS

## 2012-01-18 MED ORDER — SIMVASTATIN 20 MG PO TABS
20.0000 mg | ORAL_TABLET | Freq: Every day | ORAL | Status: DC
  Administered 2012-01-18: 20 mg via ORAL
  Filled 2012-01-18 (×2): qty 1

## 2012-01-18 MED ORDER — CALCIUM CARBONATE 600 MG PO TABS
600.0000 mg | ORAL_TABLET | Freq: Two times a day (BID) | ORAL | Status: DC
  Filled 2012-01-18: qty 1

## 2012-01-18 MED ORDER — OXYCODONE HCL 5 MG/5ML PO SOLN: 5.0000 mg | Freq: Once | ORAL | Status: AC | PRN

## 2012-01-18 MED ORDER — LISINOPRIL 10 MG PO TABS
10.0000 mg | ORAL_TABLET | Freq: Every day | ORAL | Status: DC
  Administered 2012-01-19: 10 mg via ORAL
  Filled 2012-01-18: qty 1

## 2012-01-18 SURGICAL SUPPLY — 67 items
BANDAGE ELASTIC 4 VELCRO ST LF (GAUZE/BANDAGES/DRESSINGS) ×2 IMPLANT
BANDAGE GAUZE ELAST BULKY 4 IN (GAUZE/BANDAGES/DRESSINGS) IMPLANT
BIT DRILL 2.8X5 QR DISP (BIT) ×2 IMPLANT
BLADE SURG 10 STRL SS (BLADE) ×2 IMPLANT
BNDG ESMARK 4X9 LF (GAUZE/BANDAGES/DRESSINGS) ×2 IMPLANT
CLOTH BEACON ORANGE TIMEOUT ST (SAFETY) ×2 IMPLANT
CORDS BIPOLAR (ELECTRODE) ×2 IMPLANT
COVER SURGICAL LIGHT HANDLE (MISCELLANEOUS) ×2 IMPLANT
CUFF TOURNIQUET SINGLE 18IN (TOURNIQUET CUFF) IMPLANT
CUFF TOURNIQUET SINGLE 24IN (TOURNIQUET CUFF) ×2 IMPLANT
DRAIN TLS ROUND 10FR (DRAIN) IMPLANT
DRAPE INCISE IOBAN 66X45 STRL (DRAPES) ×2 IMPLANT
DRAPE OEC MINIVIEW 54X84 (DRAPES) ×4 IMPLANT
DRAPE U-SHAPE 47X51 STRL (DRAPES) ×2 IMPLANT
DRILL QUICK RELEASE 2.0MM (BIT) ×2 IMPLANT
DRSG PAD ABDOMINAL 8X10 ST (GAUZE/BANDAGES/DRESSINGS) ×4 IMPLANT
DURAPREP 26ML APPLICATOR (WOUND CARE) ×2 IMPLANT
ELECT REM PT RETURN 9FT ADLT (ELECTROSURGICAL) ×2
ELECTRODE REM PT RTRN 9FT ADLT (ELECTROSURGICAL) ×1 IMPLANT
FACESHIELD LNG OPTICON STERILE (SAFETY) ×2 IMPLANT
GAUZE XEROFORM 1X8 LF (GAUZE/BANDAGES/DRESSINGS) ×4 IMPLANT
GLOVE BIO SURGEON ST LM GN SZ9 (GLOVE) ×2 IMPLANT
GLOVE BIOGEL PI IND STRL 8 (GLOVE) ×1 IMPLANT
GLOVE BIOGEL PI INDICATOR 8 (GLOVE) ×1
GLOVE SURG ORTHO 8.0 STRL STRW (GLOVE) ×2 IMPLANT
GOWN PREVENTION PLUS LG XLONG (DISPOSABLE) ×2 IMPLANT
GOWN PREVENTION PLUS XLARGE (GOWN DISPOSABLE) ×2 IMPLANT
GOWN STRL NON-REIN LRG LVL3 (GOWN DISPOSABLE) ×4 IMPLANT
KIT BASIN OR (CUSTOM PROCEDURE TRAY) ×2 IMPLANT
KIT ROOM TURNOVER OR (KITS) ×2 IMPLANT
MANIFOLD NEPTUNE II (INSTRUMENTS) ×2 IMPLANT
NEEDLE 22X1 1/2 (OR ONLY) (NEEDLE) ×2 IMPLANT
NS IRRIG 1000ML POUR BTL (IV SOLUTION) ×2 IMPLANT
PACK ORTHO EXTREMITY (CUSTOM PROCEDURE TRAY) ×2 IMPLANT
PAD ARMBOARD 7.5X6 YLW CONV (MISCELLANEOUS) ×4 IMPLANT
PAD CAST 3X4 CTTN HI CHSV (CAST SUPPLIES) ×1 IMPLANT
PAD CAST 4YDX4 CTTN HI CHSV (CAST SUPPLIES) ×1 IMPLANT
PADDING CAST COTTON 3X4 STRL (CAST SUPPLIES) ×1
PADDING CAST COTTON 4X4 STRL (CAST SUPPLIES) ×1
PLATE ACULOC 2 VDR-RT (Plate) ×2 IMPLANT
SCREW 3.5MMX12.0MM (Screw) ×6 IMPLANT
SCREW BN FT 16X2.3XLCK HEX CRT (Screw) ×1 IMPLANT
SCREW CORT FT 18X2.3XLCK HEX (Screw) ×1 IMPLANT
SCREW CORT FT 20X2.3XLCK HEX (Screw) ×4 IMPLANT
SCREW CORT FT 22X2.3XLCK HEX (Screw) ×4 IMPLANT
SCREW CORT FT 24X2.3XLCK HEX (Screw) ×1 IMPLANT
SCREW CORTICAL LOCKING 2.3X16M (Screw) ×1 IMPLANT
SCREW CORTICAL LOCKING 2.3X18M (Screw) ×1 IMPLANT
SCREW CORTICAL LOCKING 2.3X20M (Screw) ×4 IMPLANT
SCREW CORTICAL LOCKING 2.3X22M (Screw) ×4 IMPLANT
SCREW CORTICAL LOCKING 2.3X24M (Screw) ×1 IMPLANT
SPONGE GAUZE 4X4 12PLY (GAUZE/BANDAGES/DRESSINGS) ×4 IMPLANT
SPONGE LAP 4X18 X RAY DECT (DISPOSABLE) ×4 IMPLANT
STAPLER VISISTAT 35W (STAPLE) ×2 IMPLANT
STRIP CLOSURE SKIN 1/2X4 (GAUZE/BANDAGES/DRESSINGS) ×2 IMPLANT
SUCTION FRAZIER TIP 10 FR DISP (SUCTIONS) ×2 IMPLANT
SUT ETHILON 3 0 PS 1 (SUTURE) IMPLANT
SUT PROLENE 3 0 PS 1 (SUTURE) IMPLANT
SUT VIC AB 2-0 CTB1 (SUTURE) ×2 IMPLANT
SUT VIC AB 3-0 X1 27 (SUTURE) IMPLANT
SYR CONTROL 10ML LL (SYRINGE) ×2 IMPLANT
SYSTEM CHEST DRAIN TLS 7FR (DRAIN) IMPLANT
TOWEL OR 17X24 6PK STRL BLUE (TOWEL DISPOSABLE) ×2 IMPLANT
TOWEL OR 17X26 10 PK STRL BLUE (TOWEL DISPOSABLE) ×2 IMPLANT
TUBE CONNECTING 12X1/4 (SUCTIONS) ×2 IMPLANT
WATER STERILE IRR 1000ML POUR (IV SOLUTION) ×2 IMPLANT
YANKAUER SUCT BULB TIP NO VENT (SUCTIONS) ×2 IMPLANT

## 2012-01-18 NOTE — Interval H&P Note (Signed)
History and Physical Interval Note:  01/18/2012 1:34 PM  Tara [name]  has presented today for surgery, with the diagnosis of Right Wrist Fracture  The various methods of treatment have been discussed with the patient and family. After consideration of risks, benefits and other options for treatment, the patient has consented to  Procedure(s) (LRB) with comments: OPEN REDUCTION INTERNAL FIXATION (ORIF) WRIST FRACTURE (Right) - Open Reduction Internal Fixation right Wrist as a surgical intervention .  The patient's history has been reviewed, patient examined, no change in status, stable for surgery.  I have reviewed the patient's chart and labs.  Questions were answered to the patient's satisfaction.     Karol Skarzynski SCOTT

## 2012-01-18 NOTE — Anesthesia Preprocedure Evaluation (Addendum)
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 Dental  (+) Teeth Intact   Pulmonary asthma , pneumonia -,  breath sounds clear to auscultation        Cardiovascular hypertension, Rhythm:Regular Rate:Normal     Neuro/Psych  Neuromuscular disease    GI/Hepatic PUD, GERD-  ,  Endo/Other  diabetesMorbid obesity  Renal/GU      Musculoskeletal   Abdominal (+) + obese,   Peds  Hematology anemic   Anesthesia Other Findings   Reproductive/Obstetrics                          Anesthesia Physical Anesthesia Plan  ASA: III  Anesthesia Plan: General   Post-op Pain Management:    Induction: Intravenous  Airway Management Planned: Oral ETT  Additional Equipment:   Intra-op Plan:   Post-operative Plan: Extubation in OR  Informed Consent: I have reviewed the patients History and Physical, chart, labs and discussed the procedure including the risks, benefits and alternatives for the proposed anesthesia with the patient or authorized representative who has indicated his/her understanding and acceptance.   Dental advisory given  Plan Discussed with:   Anesthesia Plan Comments:         Anesthesia Quick Evaluation

## 2012-01-18 NOTE — Preoperative (Signed)
Beta Blockers   Reason not to administer Beta Blockers:Not Applicable 

## 2012-01-18 NOTE — Brief Op Note (Signed)
01/18/2012  4:57 PM  PATIENT:  Nance Pear  56 y.o. female  PRE-OPERATIVE DIAGNOSIS:  Right Wrist Fracture  POST-OPERATIVE DIAGNOSIS:  Right Wrist Fracture  PROCEDURE:  Procedure(s): OPEN REDUCTION INTERNAL FIXATION (ORIF) WRIST FRACTURE  SURGEON:  Surgeon(s): Cammy Copa, MD  ASSISTANT:s vernon  ANESTHESIA:   general  EBL: 44 ml    Total I/O In: 1000 [I.V.:1000] Out: -   BLOOD ADMINISTERED: none  DRAINS: none   LOCAL MEDICATIONS USED:  none  SPECIMEN:  No Specimen  COUNTS:  YES  TOURNIQUET:   Total Tourniquet Time Documented: Upper Arm (Right) - 106 minutes  DICTATION: .Other Dictation: Dictation Number [id number]  PLAN OF CARE: Admit for overnight observation  PATIENT DISPOSITION:  PACU - hemodynamically stable

## 2012-01-18 NOTE — Transfer of Care (Signed)
Immediate Anesthesia Transfer of Care Note  Patient: Tara [name]  Procedure(s) Performed: Procedure(s) (LRB) with comments: OPEN REDUCTION INTERNAL FIXATION (ORIF) WRIST FRACTURE (Right) - Open Reduction Internal Fixation right Wrist  Patient Location: PACU  Anesthesia Type: General  Level of Consciousness: awake, alert  and oriented  Airway & Oxygen Therapy: Patient connected to nasal cannula oxygen  Post-op Assessment: Report given to PACU RN, Post -op Vital signs reviewed and stable and Patient moving all extremities X 4  Post vital signs: Reviewed and stable  Complications: No apparent anesthesia complications

## 2012-01-18 NOTE — Anesthesia Postprocedure Evaluation (Signed)
Anesthesia Post Note  Patient: Tara [name]  Procedure(s) Performed: Procedure(s) (LRB): OPEN REDUCTION INTERNAL FIXATION (ORIF) WRIST FRACTURE (Right)  Anesthesia type: General  Patient location: PACU  Post pain: Pain level controlled and Adequate analgesia  Post assessment: Post-op Vital signs reviewed, Patient's Cardiovascular Status Stable, Respiratory Function Stable, Patent Airway and Pain level controlled  Last Vitals:  Filed Vitals:   01/18/12 1650  BP:   Pulse:   Temp: 36.3 C  Resp:     Post vital signs: Reviewed and stable  Level of consciousness: awake, alert  and oriented  Complications: No apparent anesthesia complications

## 2012-01-19 ENCOUNTER — Encounter (HOSPITAL_COMMUNITY): Payer: Self-pay | Admitting: Orthopedic Surgery

## 2012-01-19 LAB — GLUCOSE, CAPILLARY
Glucose-Capillary: 128 mg/dL — ABNORMAL HIGH (ref 70–99)
Glucose-Capillary: 128 mg/dL — ABNORMAL HIGH (ref 70–99)

## 2012-01-19 MED ORDER — METHOCARBAMOL 500 MG PO TABS: 500.0000 mg | ORAL_TABLET | Freq: Four times a day (QID) | ORAL | Status: DC | PRN

## 2012-01-19 MED ORDER — OXYCODONE-ACETAMINOPHEN 10-325 MG PO TABS: 1.0000 | ORAL_TABLET | ORAL | Status: DC | PRN

## 2012-01-19 NOTE — Progress Notes (Signed)
CARE MANAGEMENT NOTE 01/19/2012  Patient:  Tara [name], Tara [name]   Account Number:  [id number]  Date Initiated:  01/19/2012  Documentation initiated by:  Vance Peper  Subjective/Objective Assessment:   56 yr old female s/p ORIF of right wrist, s/p fall.     Action/Plan:   CM spoke with patient and husband. No home health needs identified. patient is under worker's comp.   Anticipated DC Date:  01/19/2012   Anticipated DC Plan:  HOME/SELF CARE      DC Planning Services  CM consult      Choice offered to / List presented to:             Status of service:  Completed, signed off Medicare Important Message given?   (If response is "NO", the following Medicare IM given date fields will be blank) Date Medicare IM given:   Date Additional Medicare IM given:    Discharge Disposition:  HOME/SELF CARE  Per UR Regulation:    If discussed at Long Length of Stay Meetings, dates discussed:    Comments:

## 2012-01-19 NOTE — Discharge Summary (Signed)
Physician Discharge Summary  Patient ID: Tara [name] MRN: [id number] DOB/AGE: January 14, 1956 56 y.o.  Admit date: 01/18/2012 Discharge date: 01/19/2012  Admission Diagnoses:  Wrist fracture  Discharge Diagnoses:  Same  Surgeries: Procedure(s): OPEN REDUCTION INTERNAL FIXATION (ORIF) WRIST FRACTURE on 01/18/2012   Consultants:    Discharged Condition: Stable  Hospital Course: Tara [name] is an 56 y.o. female who was admitted 01/18/2012 with a chief complaint of wrist pain, and found to have a diagnosis of wrist fracture.  They were brought to the operating room on 01/18/2012 and underwent the above named procedures.    Antibiotics given:  Anti-infectives     Start     Dose/Rate Route Frequency Ordered Stop   01/18/12 2230   clindamycin (CLEOCIN) IVPB 600 mg        600 mg 100 mL/hr over 30 Minutes Intravenous Every 8 hours 01/18/12 1820 01/19/12 2229   01/18/12 1028   clindamycin (CLEOCIN) IVPB 900 mg        900 mg 100 mL/hr over 30 Minutes Intravenous 60 min pre-op 01/18/12 1028 01/18/12 1420        .  Recent vital signs:  Filed Vitals:   01/19/12 0644  BP: 150/68  Pulse: 105  Temp: 98.7 F (37.1 C)  Resp: 18    Recent laboratory studies:  Results for orders placed during the hospital encounter of 01/18/12  GLUCOSE, CAPILLARY      Component Value Range   Glucose-Capillary 85  70 - 99 mg/dL  GLUCOSE, CAPILLARY      Component Value Range   Glucose-Capillary 141 (*) 70 - 99 mg/dL   Comment 1 Notify RN    GLUCOSE, CAPILLARY      Component Value Range   Glucose-Capillary 143 (*) 70 - 99 mg/dL  GLUCOSE, CAPILLARY      Component Value Range   Glucose-Capillary 128 (*) 70 - 99 mg/dL  GLUCOSE, CAPILLARY      Component Value Range   Glucose-Capillary 128 (*) 70 - 99 mg/dL   Comment 1 Documented in Chart     Comment 2 Notify RN      Discharge Medications:     Medication List     As of 01/19/2012 12:35 PM    STOP taking these medications        oxyCODONE-acetaminophen 5-325 MG per tablet   Commonly known as: PERCOCET/ROXICET      TAKE these medications         ANTARA 130 MG capsule   Generic drug: fenofibrate micronized   Take 130 mg by mouth daily before breakfast.      aspirin 325 MG tablet   Take 325 mg by mouth daily.      calcium carbonate 600 MG Tabs   Commonly known as: OS-CAL   Take 600 mg by mouth 2 (two) times daily with a meal.      cholecalciferol 1000 UNITS tablet   Commonly known as: VITAMIN D   Take 2,000 Units by mouth 2 (two) times daily.      fish oil-omega-3 fatty acids 1000 MG capsule   Take 2 g by mouth daily.      ibuprofen 600 MG tablet   Commonly known as: ADVIL,MOTRIN   Take 600 mg by mouth every 6 (six) hours as needed. For pain.      lisinopril-hydrochlorothiazide 10-12.5 MG per tablet   Commonly known as: PRINZIDE,ZESTORETIC   Take 1 tablet by mouth daily.      metFORMIN 500  MG tablet   Commonly known as: GLUCOPHAGE   Take 500 mg by mouth daily with breakfast.      methocarbamol 500 MG tablet   Commonly known as: ROBAXIN   Take 1 tablet (500 mg total) by mouth every 6 (six) hours as needed.      multivitamin with minerals Tabs   Take 1 tablet by mouth daily.      niacin-simvastatin 500-20 MG 24 hr tablet   Commonly known as: SIMCOR   Take 1 tablet by mouth at bedtime.      omeprazole 40 MG capsule   Commonly known as: PRILOSEC   Take 40 mg by mouth 2 (two) times daily.      oxyCODONE-acetaminophen 10-325 MG per tablet   Commonly known as: PERCOCET   Take 1 tablet by mouth every 4 (four) hours as needed for pain.        Diagnostic Studies: Dg Chest 1 View  01/17/2012  *RADIOLOGY REPORT*  Clinical Data: Preop wrist fracture  CHEST - 1 VIEW  Comparison: None.  Findings: Heart size and vascularity are normal.  Lungs are clear without infiltrate or effusion.  Bony density overlying the right shoulder may be related to myositis ossificans  IMPRESSION: No active cardiopulmonary  disease.   Original Report Authenticated By: Camelia Phenes, M.D.     Wrist 2 Views Right  01/18/2012  *RADIOLOGY REPORT*  Clinical Data: Fracture, pain  RIGHT WRIST - 2 VIEW  Comparison: 01/17/2012  Findings: Open reduction internal fixation distal radius fracture. Improved position and alignment.  IMPRESSION: As above.   Original Report Authenticated By: Elsie Stain, M.D.    Ct Wrist Right Wo Contrast  01/17/2012  *RADIOLOGY REPORT*  Clinical Data: Evaluate wrist fracture.  CT OF THE RIGHT WRIST WITHOUT CONTRAST  Technique:  Multidetector CT imaging was performed according to the standard protocol. Multiplanar CT image reconstructions were also generated.  Comparison: None  Findings: Examination is limited due to positioning, plaster splint and body habitus.  Moderate artifact.  There is a comminuted intra-articular fracture of the distal radius with moderate impaction but no dorsal or volar displacement.  The carpal bones are intact.  The ulna is intact.  IMPRESSION:  1.  Limited examination. 2.  Comminuted intra-articular distal radius fracture with mild impaction but no significant dorsal or volar displacement. 2.  Intact carpal bones.   Original Report Authenticated By: P. Loralie Champagne, M.D.     Disposition: Final discharge disposition not confirmed      Discharge Orders    Future Orders Please Complete By Expires   Diet - low sodium heart healthy      Call MD / Call 911      Comments:   If you experience chest pain or shortness of breath, CALL 911 and be transported to the hospital emergency room.  If you develope a fever above 101 F, pus (white drainage) or increased drainage or redness at the wound, or calf pain, call your surgeon's office.   Constipation Prevention      Comments:   Drink plenty of fluids.  Prune juice may be helpful.  You may use a stool softener, such as Colace (over the counter) 100 mg twice a day.  Use MiraLax (over the counter) for constipation as needed.    Increase activity slowly as tolerated      Discharge instructions      Comments:   Elevate hand  Keep splint dry Move fingers  SignedCammy Copa 01/19/2012, 12:35 PM

## 2012-01-19 NOTE — Progress Notes (Signed)
Pt stable - pain controlled Hand r perfused and sensate with mild distal fingertip paresthesias Plan dc today Clamshell in place fu next week

## 2012-01-19 NOTE — Progress Notes (Signed)
Occupational Therapy Evaluation Patient Details Name: Tara [name] MRN: [id number] DOB: 1955/12/01 Today's Date: 01/19/2012 Time: 4098-1191 OT Time Calculation (min): 36 min  OT Assessment / Plan / Recommendation Clinical Impression  Pt s/p fall at work resulting in right proximal humeral fx and right distal radial fx (treated in [place] where fall occurred).  Pt now s/p ORIF of right radial fx. Sarmiento splint applied to RUE (prior to this admission for ORIF)  for humeral fx. Educated pt on HEP (elbow and digit ROM) and ADL techniques. No further acute OT needs.  Recommend progress RUE rehab as recommended by MD at f/u.      OT Assessment  Patient does not need any further OT services    Follow Up Recommendations   (progress RUE rehab as recommended by MD at f/u)    Barriers to Discharge      Equipment Recommendations  None recommended by OT    Recommendations for Other Services    Frequency       Precautions / Restrictions Precautions Required Braces or Orthoses: Other Brace/Splint Other Brace/Splint: R UE sling; right sarmietno splint for right prox humeral fx Restrictions Weight Bearing Restrictions: Yes RUE Weight Bearing: Weight bearing as tolerated   Pertinent Vitals/Pain See vitals    ADL  Eating/Feeding: Performed;Set up (setup for opening containers) Where Assessed - Eating/Feeding: Chair Upper Body Bathing: Simulated;Minimal assistance Where Assessed - Upper Body Bathing: Unsupported sitting Upper Body Dressing: Simulated;Minimal assistance Where Assessed - Upper Body Dressing: Unsupported sitting Toilet Transfer: Simulated;Supervision/safety Toilet Transfer Method: Sit to Barista:  (chair) Equipment Used:  (R  UE sling) Transfers/Ambulation Related to ADLs: supervision for safety ADL Comments: Pt reports difficulty performing toileting hygiene due to decreased RUE functional use.  Educated pt on use of open faced tongs as  toilet aid to maximize independence. Pt fully dressed upon OT arrival and reports that she only needed min assist to complete dressing tasks.  Educated pt on threading RUE through shirt sleeve first when donning shirt. Pt had right rotator cuff repair 08/2011 and is familiar with RUE sling and is able to demonstrate proper positioning as well as independent in directing spouse how to assist.  Pt's husband also independent in assisting pt with ADLs.  Pt reports no discomfort or pain from sarmiento splint which was applied PTA.  Pt reports that she occaionally feels she needs to hold on to objects due to fear of falling.  Husband present during session and is physically able to assist pt as needed.     OT Diagnosis:    OT Problem List:   OT Treatment Interventions:     OT Goals    Visit Information  Last OT Received On: 01/19/12 Assistance Needed: +1    Subjective Data      Prior Functioning  Vision/Perception  Home Living Lives With: Spouse Available Help at Discharge: Family;Available 24 hours/day Type of Home: House Home Access: Stairs to enter [employer] of Steps: 1 Home Layout: One level Bathroom Shower/Tub: Health visitor: Standard Home Adaptive Equipment: Bedside commode/3-in-1;Straight cane;Quad cane Prior Function Level of Independence: Independent Vocation: Full time employment Communication Communication: No difficulties Dominant Hand: Right      Cognition  Overall Cognitive Status: Appears within functional limits for tasks assessed/performed Arousal/Alertness: Awake/alert Orientation Level: Appears intact for tasks assessed Behavior During Session: [facility] for tasks performed    Extremity/Trunk Assessment Right Upper Extremity Assessment RUE ROM/Strength/Tone: Deficits RUE ROM/Strength/Tone Deficits: Shoulder and wrist not  assessed due to precautions and dressing/splint. Spoke with MD August Saucer) and clarified pt is allowed to perform elbow  ROM.  Digit and elbow AROM WFL. RUE Sensation: WFL - Light Touch Left Upper Extremity Assessment LUE ROM/Strength/Tone: Within functional levels   Mobility  Shoulder Instructions  Bed Mobility Bed Mobility: Not assessed Transfers Transfers: Stand to Sit;Sit to Stand Sit to Stand: 5: Supervision;From chair/3-in-1;With upper extremity assist Stand to Sit: 5: Supervision;To chair/3-in-1;With upper extremity assist Details for Transfer Assistance: supervision for safety       Exercise General Exercises - Upper Extremity Elbow Flexion: AROM;Right;10 reps;Seated Elbow Extension: AROM;Right;10 reps;Seated Digit Composite Flexion: AROM;Right;10 reps;Seated Composite Extension: AROM;Right;10 reps;Seated   Balance     End of Session OT - End of Session Equipment Utilized During Treatment:  (RUE sling) Activity Tolerance: Patient tolerated treatment well Patient left: in chair;with call bell/phone within reach;with family/visitor present Nurse Communication: Mobility status  GO    01/19/2012 Cipriano Mile OTR/L Pager [phone] Office [phone] Cipriano Mile 01/19/2012, 2:52 PM

## 2012-01-19 NOTE — Progress Notes (Signed)
Utilization review completed. Damaya Channing, RN, BSN. 

## 2012-01-19 NOTE — Op Note (Signed)
[name], [name]             ACCOUNT NO.:  [id number]  MEDICAL RECORD NO.:  [id number]  LOCATION:  [id number]                        FACILITY:  [facility]  PHYSICIAN:  Burnard Bunting, M.D.    DATE OF BIRTH:  1956-01-05  DATE OF PROCEDURE:  01/18/2012 DATE OF DISCHARGE:                              OPERATIVE REPORT   PREOPERATIVE DIAGNOSIS:  Right comminuted multiple part intra-articular distal radius fracture.  POSTOPERATIVE DIAGNOSIS:  Right comminuted multiple part intra-articular distal radius fracture.  PROCEDURE:  Right wrist fracture open reduction and internal fixation of greater than 3 intra-articular fragments.  SURGEON:  Burnard Bunting, M.D.  ASSISTANT:  Wende Neighbors, P.A.  ANESTHESIA:  General endotracheal.  ESTIMATED BLOOD LOSS:  10 mL.  DRAINS:  None.  INDICATIONS:  Tara [name] is a patient who fell at work.  She sustained proximal humerus fracture, right distal radius fracture.  She presents now for operative management after explanation of risks and benefits of the distal radius fracture.  PROCEDURE IN DETAIL:  The patient was brought to the operating room, where general endotracheal anesthesia was induced.  Preoperative antibiotics administered.  Right arm was prescribed with alcohol and Betadine, which was allowed to air dry, prepped with DuraPrep solution, draped in a sterile manner.  Time-out was called.  [name] was used to cover the operative field.  Arm was elevated, exsanguinated with Esmarch wrap.  Tourniquet was inflated.  Incision was made over the FCR tendon. Skin and subcutaneous tissue sharply divided.  Tendon was mobilized radially.  Floor of the sheath was incised.  Pronator quadratus was incised on the radial aspect of the radial shaft and periosteal elevation was performed.  The fracture was highly comminuted.  Multiple impacted fragments joint surface was elevated with a pushing elevator. Plate was applied.  Adequate reduction was noted  in the AP and lateral planes under fluoroscopy.  Proximal screw was placed.  Distal screws were placed.  Good reduction was maintained.  There was about a 1 mm of gapping between the main articular fragments, but there was minimal step- off.  At this time, the tourniquet was released.  Bleeding points were controlled with bipolar electrocautery.  Thorough irrigation was performed.  Incision was closed using interrupted inverted 3-0 Vicryl suture and 3-0 nylon sutures.  Bulky well-padded volar splint was applied.  In regards to the proximal humerus fracture, SOMI brace was applied to the right proximal humerus.  The patient was then placed into a sling.  [name]'s assistance was required at all times during the case for opening and closing retraction.  Her assistance was a medical necessity.     Burnard Bunting, M.D.     GSD/MEDQ  D:  01/18/2012  T:  01/19/2012  Job:  [id number]

## 2012-01-20 ENCOUNTER — Encounter: Payer: Self-pay | Admitting: Gastroenterology

## 2012-06-19 ENCOUNTER — Other Ambulatory Visit: Payer: Self-pay | Admitting: Orthopedic Surgery

## 2012-06-22 ENCOUNTER — Encounter (HOSPITAL_COMMUNITY): Payer: Self-pay | Admitting: Pharmacy Technician

## 2012-06-28 ENCOUNTER — Encounter (HOSPITAL_COMMUNITY)
Admission: RE | Admit: 2012-06-28 | Discharge: 2012-06-28 | Disposition: A | Discharge status: Home / Self Care | Payer: Worker's Compensation | Source: Ambulatory Visit | Attending: Orthopedic Surgery | Admitting: Orthopedic Surgery

## 2012-06-28 ENCOUNTER — Encounter (HOSPITAL_COMMUNITY): Payer: Self-pay

## 2012-06-28 HISTORY — DX: Headache: R51

## 2012-06-28 LAB — CBC
MCH: 26.8 pg (ref 26.0–34.0)
MCHC: 33 g/dL (ref 30.0–36.0)
MCV: 81.3 fL (ref 78.0–100.0)
Platelets: 307 10*3/uL (ref 150–400)
RDW: 15.6 % — ABNORMAL HIGH (ref 11.5–15.5)

## 2012-06-28 LAB — BASIC METABOLIC PANEL
CO2: 26 mEq/L (ref 19–32)
Calcium: 9.6 mg/dL (ref 8.4–10.5)
Creatinine, Ser: 0.58 mg/dL (ref 0.50–1.10)
GFR calc non Af Amer: >90 mL/min (ref >90)
Glucose, Bld: 108 mg/dL — ABNORMAL HIGH (ref 70–99)

## 2012-06-28 MED ORDER — CHLORHEXIDINE GLUCONATE 4 % EX LIQD: 60.0000 mL | Freq: Once | CUTANEOUS | Status: DC

## 2012-06-28 NOTE — Progress Notes (Signed)
06/28/12 0852  OBSTRUCTIVE SLEEP APNEA  Do you snore loudly (loud enough to be heard through closed doors)?  1  Do you often feel tired, fatigued, or sleepy during the daytime? 0  Has anyone observed you stop breathing during your sleep? 0  Do you have, or are you being treated for high blood pressure? 1  BMI more than 35 kg/m2? 1  Age over 58 years old? 1  Neck circumference greater than 40 cm/18 inches? 1  Gender: 0  Obstructive Sleep Apnea Score 5  Score 4 or greater  Results sent to PCP

## 2012-06-28 NOTE — Pre-Procedure Instructions (Signed)
Tara [name]  06/28/2012   Your procedure is scheduled on:  Tuesday July 03, 2012  Report to [facility] at 0530 AM.  Call this number if you have problems the morning of surgery: [phone]   Remember:   Do not eat food or drink liquids after midnight.Monday   Take these medicines the morning of surgery with A SIP OF WATER: Prilosec(Omeprazole), and Oxycodone- Acetaminophen(Percocet) if needed.   Do not wear jewelry, make-up or nail polish.  Do not wear lotions, powders, or perfumes. You may wear deodorant.  Do not shave 48 hours prior to surgery.   Do not bring valuables to the hospital.  Contacts, dentures or bridgework may not be worn into surgery.  Leave suitcase in the car. After surgery it may be brought to your room.  For patients admitted to the hospital, checkout time is 11:00 AM the day of  discharge.   Patients discharged the day of surgery will not be allowed to drive  home.    Special Instructions: Shower using CHG 2 nights before surgery and the night before surgery.  If you shower the day of surgery use CHG.  Use special wash - you have one bottle of CHG for all showers.  You should use approximately 1/3 of the bottle for each shower.   Please read over the following fact sheets that you were given: Pain Booklet, Coughing and Deep Breathing, MRSA Information and Surgical Site Infection Prevention

## 2012-06-28 NOTE — Progress Notes (Signed)
Dr August Saucer office made aware to release signed and held  orders for PAT visit.

## 2012-07-03 MED ORDER — CHLORHEXIDINE GLUCONATE 4 % EX LIQD: 60.0000 mL | Freq: Once | CUTANEOUS | Status: DC

## 2012-07-03 MED ORDER — CLINDAMYCIN PHOSPHATE 900 MG/50ML IV SOLN
900.0000 mg | INTRAVENOUS | Status: AC
  Administered 2012-07-04: 900 mg via INTRAVENOUS
  Filled 2012-07-03: qty 50

## 2012-07-03 NOTE — H&P (Signed)
  D [phone]

## 2012-07-04 ENCOUNTER — Ambulatory Visit (HOSPITAL_COMMUNITY): Payer: Worker's Compensation

## 2012-07-04 ENCOUNTER — Encounter (HOSPITAL_COMMUNITY): Payer: Self-pay | Admitting: Anesthesiology

## 2012-07-04 ENCOUNTER — Ambulatory Visit (HOSPITAL_COMMUNITY): Payer: Worker's Compensation | Admitting: Anesthesiology

## 2012-07-04 ENCOUNTER — Observation Stay (HOSPITAL_COMMUNITY)
Admission: RE | Admit: 2012-07-04 | Discharge: 2012-07-06 | Disposition: A | Discharge status: Home / Self Care | Payer: Worker's Compensation | Source: Ambulatory Visit | Attending: Orthopedic Surgery | Admitting: Orthopedic Surgery

## 2012-07-04 ENCOUNTER — Encounter (HOSPITAL_COMMUNITY): Payer: Self-pay | Admitting: *Deleted

## 2012-07-04 ENCOUNTER — Encounter (HOSPITAL_COMMUNITY)
Admission: RE | Disposition: A | Discharge status: Home / Self Care | Payer: Self-pay | Source: Ambulatory Visit | Attending: Orthopedic Surgery

## 2012-07-04 DIAGNOSIS — M75121 Complete rotator cuff tear or rupture of right shoulder, not specified as traumatic: Secondary | ICD-10-CM | POA: Diagnosis present

## 2012-07-04 DIAGNOSIS — S42341G Displaced spiral fracture of shaft of humerus, right arm, subsequent encounter for fracture with delayed healing: Secondary | ICD-10-CM

## 2012-07-04 DIAGNOSIS — E119 Type 2 diabetes mellitus without complications: Secondary | ICD-10-CM | POA: Insufficient documentation

## 2012-07-04 DIAGNOSIS — I10 Essential (primary) hypertension: Secondary | ICD-10-CM | POA: Insufficient documentation

## 2012-07-04 DIAGNOSIS — Y9289 Other specified places as the place of occurrence of the external cause: Secondary | ICD-10-CM | POA: Insufficient documentation

## 2012-07-04 DIAGNOSIS — Z01812 Encounter for preprocedural laboratory examination: Secondary | ICD-10-CM | POA: Insufficient documentation

## 2012-07-04 DIAGNOSIS — M719 Bursopathy, unspecified: Secondary | ICD-10-CM | POA: Insufficient documentation

## 2012-07-04 DIAGNOSIS — IMO0002 Reserved for concepts with insufficient information to code with codable children: Principal | ICD-10-CM | POA: Insufficient documentation

## 2012-07-04 DIAGNOSIS — S42309S Unspecified fracture of shaft of humerus, unspecified arm, sequela: Secondary | ICD-10-CM | POA: Insufficient documentation

## 2012-07-04 DIAGNOSIS — M67919 Unspecified disorder of synovium and tendon, unspecified shoulder: Secondary | ICD-10-CM | POA: Insufficient documentation

## 2012-07-04 DIAGNOSIS — W19XXXS Unspecified fall, sequela: Secondary | ICD-10-CM | POA: Insufficient documentation

## 2012-07-04 HISTORY — PX: ORIF HUMERUS FRACTURE: SHX2126

## 2012-07-04 HISTORY — PX: SHOULDER OPEN ROTATOR CUFF REPAIR: SHX2407

## 2012-07-04 LAB — GLUCOSE, CAPILLARY
Glucose-Capillary: 108 mg/dL — ABNORMAL HIGH (ref 70–99)
Glucose-Capillary: 173 mg/dL — ABNORMAL HIGH (ref 70–99)

## 2012-07-04 SURGERY — REPAIR, ROTATOR CUFF, OPEN
Anesthesia: General | Site: Shoulder | Laterality: Right | Wound class: Clean

## 2012-07-04 MED ORDER — MIDAZOLAM HCL 5 MG/5ML IJ SOLN
INTRAMUSCULAR | Status: DC | PRN
  Administered 2012-07-04: 2 mg via INTRAVENOUS

## 2012-07-04 MED ORDER — OXYCODONE HCL 5 MG PO TABS
5.0000 mg | ORAL_TABLET | ORAL | Status: DC | PRN
  Administered 2012-07-05 – 2012-07-06 (×5): 5 mg via ORAL
  Filled 2012-07-04 (×5): qty 1

## 2012-07-04 MED ORDER — ONDANSETRON HCL 4 MG PO TABS: 4.0000 mg | ORAL_TABLET | Freq: Four times a day (QID) | ORAL | Status: DC | PRN

## 2012-07-04 MED ORDER — NALOXONE HCL 0.4 MG/ML IJ SOLN: 0.4000 mg | INTRAMUSCULAR | Status: DC | PRN

## 2012-07-04 MED ORDER — TEMAZEPAM 15 MG PO CAPS: 15.0000 mg | ORAL_CAPSULE | Freq: Every evening | ORAL | Status: DC | PRN

## 2012-07-04 MED ORDER — NIACIN-SIMVASTATIN ER 500-20 MG PO TB24: 1.0000 | ORAL_TABLET | Freq: Every day | ORAL | Status: DC

## 2012-07-04 MED ORDER — MORPHINE SULFATE (PF) 1 MG/ML IV SOLN
INTRAVENOUS | Status: DC
  Administered 2012-07-04: 1 mg via INTRAVENOUS
  Administered 2012-07-05: 5 mg via INTRAVENOUS
  Administered 2012-07-05: 14 mg via INTRAVENOUS
  Administered 2012-07-05: 2 mg via INTRAVENOUS
  Filled 2012-07-04: qty 25

## 2012-07-04 MED ORDER — LISINOPRIL 10 MG PO TABS
10.0000 mg | ORAL_TABLET | Freq: Every day | ORAL | Status: DC
  Administered 2012-07-05 – 2012-07-06 (×2): 10 mg via ORAL
  Filled 2012-07-04 (×2): qty 1

## 2012-07-04 MED ORDER — LACTATED RINGERS IV SOLN
INTRAVENOUS | Status: DC | PRN
  Administered 2012-07-04 (×3): via INTRAVENOUS

## 2012-07-04 MED ORDER — PHENYLEPHRINE HCL 10 MG/ML IJ SOLN
INTRAMUSCULAR | Status: DC | PRN
  Administered 2012-07-04 (×4): 80 ug via INTRAVENOUS
  Administered 2012-07-04 (×3): 120 ug via INTRAVENOUS
  Administered 2012-07-04: 40 ug via INTRAVENOUS

## 2012-07-04 MED ORDER — PROMETHAZINE HCL 25 MG/ML IJ SOLN: 6.2500 mg | INTRAMUSCULAR | Status: DC | PRN

## 2012-07-04 MED ORDER — METHOCARBAMOL 500 MG PO TABS
500.0000 mg | ORAL_TABLET | Freq: Four times a day (QID) | ORAL | Status: DC | PRN
  Administered 2012-07-05 (×2): 500 mg via ORAL
  Filled 2012-07-04 (×2): qty 1

## 2012-07-04 MED ORDER — ALBUTEROL SULFATE (5 MG/ML) 0.5% IN NEBU: 2.5000 mg | INHALATION_SOLUTION | Freq: Four times a day (QID) | RESPIRATORY_TRACT | Status: DC | PRN

## 2012-07-04 MED ORDER — LISINOPRIL-HYDROCHLOROTHIAZIDE 10-12.5 MG PO TABS: 1.0000 | ORAL_TABLET | Freq: Every day | ORAL | Status: DC

## 2012-07-04 MED ORDER — METHOCARBAMOL 100 MG/ML IJ SOLN: 500.0000 mg | Freq: Four times a day (QID) | INTRAVENOUS | Status: DC | PRN

## 2012-07-04 MED ORDER — METFORMIN HCL 500 MG PO TABS
500.0000 mg | ORAL_TABLET | Freq: Every day | ORAL | Status: DC
  Administered 2012-07-05 (×2): 500 mg via ORAL
  Filled 2012-07-04 (×3): qty 1

## 2012-07-04 MED ORDER — ONDANSETRON HCL 4 MG/2ML IJ SOLN
INTRAMUSCULAR | Status: DC | PRN
  Administered 2012-07-04: 4 mg via INTRAVENOUS

## 2012-07-04 MED ORDER — OXYCODONE-ACETAMINOPHEN 5-325 MG PO TABS
1.0000 | ORAL_TABLET | ORAL | Status: DC | PRN
  Administered 2012-07-05 – 2012-07-06 (×5): 1 via ORAL
  Filled 2012-07-04 (×5): qty 1

## 2012-07-04 MED ORDER — OXYCODONE-ACETAMINOPHEN 10-325 MG PO TABS: 1.0000 | ORAL_TABLET | ORAL | Status: DC | PRN

## 2012-07-04 MED ORDER — ONDANSETRON HCL 4 MG/2ML IJ SOLN: 4.0000 mg | Freq: Four times a day (QID) | INTRAMUSCULAR | Status: DC | PRN

## 2012-07-04 MED ORDER — CALCIUM CARBONATE 1250 (500 CA) MG PO TABS
1.0000 | ORAL_TABLET | Freq: Two times a day (BID) | ORAL | Status: DC
  Administered 2012-07-05 – 2012-07-06 (×3): 500 mg via ORAL
  Filled 2012-07-04 (×6): qty 1

## 2012-07-04 MED ORDER — NEOSTIGMINE METHYLSULFATE 1 MG/ML IJ SOLN
INTRAMUSCULAR | Status: DC | PRN
  Administered 2012-07-04: .5 mg via INTRAVENOUS
  Administered 2012-07-04: 1 mg via INTRAVENOUS
  Administered 2012-07-04: 4 mg via INTRAVENOUS

## 2012-07-04 MED ORDER — CLINDAMYCIN PHOSPHATE 600 MG/50ML IV SOLN
600.0000 mg | Freq: Three times a day (TID) | INTRAVENOUS | Status: AC
  Administered 2012-07-05 (×2): 600 mg via INTRAVENOUS
  Filled 2012-07-04 (×2): qty 50

## 2012-07-04 MED ORDER — FENTANYL CITRATE 0.05 MG/ML IJ SOLN
INTRAMUSCULAR | Status: DC | PRN
  Administered 2012-07-04: 100 ug via INTRAVENOUS
  Administered 2012-07-04 (×2): 25 ug via INTRAVENOUS
  Administered 2012-07-04: 100 ug via INTRAVENOUS

## 2012-07-04 MED ORDER — DEXAMETHASONE SODIUM PHOSPHATE 4 MG/ML IJ SOLN
INTRAMUSCULAR | Status: DC | PRN
  Administered 2012-07-04: 8 mg

## 2012-07-04 MED ORDER — ACETAMINOPHEN 10 MG/ML IV SOLN
1000.0000 mg | Freq: Once | INTRAVENOUS | Status: AC
  Administered 2012-07-04: 1000 mg via INTRAVENOUS

## 2012-07-04 MED ORDER — ROCURONIUM BROMIDE 100 MG/10ML IV SOLN
INTRAVENOUS | Status: DC | PRN
  Administered 2012-07-04: 10 mg via INTRAVENOUS
  Administered 2012-07-04: 30 mg via INTRAVENOUS
  Administered 2012-07-04: 5 mg via INTRAVENOUS
  Administered 2012-07-04: 10 mg via INTRAVENOUS
  Administered 2012-07-04: 5 mg via INTRAVENOUS
  Administered 2012-07-04: 10 mg via INTRAVENOUS

## 2012-07-04 MED ORDER — INSULIN ASPART 100 UNIT/ML ~~LOC~~ SOLN: 0.0000 [IU] | Freq: Three times a day (TID) | SUBCUTANEOUS | Status: DC

## 2012-07-04 MED ORDER — PROPOFOL 10 MG/ML IV BOLUS
INTRAVENOUS | Status: DC | PRN
  Administered 2012-07-04: 200 mg via INTRAVENOUS

## 2012-07-04 MED ORDER — OXYCODONE HCL 5 MG/5ML PO SOLN: 5.0000 mg | Freq: Once | ORAL | Status: DC | PRN

## 2012-07-04 MED ORDER — ALBUMIN HUMAN 5 % IV SOLN
INTRAVENOUS | Status: DC | PRN
  Administered 2012-07-04 (×3): via INTRAVENOUS

## 2012-07-04 MED ORDER — NIACIN ER 500 MG PO CPCR
500.0000 mg | ORAL_CAPSULE | Freq: Every day | ORAL | Status: DC
  Filled 2012-07-04 (×3): qty 1

## 2012-07-04 MED ORDER — VITAMIN D 1000 UNITS PO TABS
2000.0000 [IU] | ORAL_TABLET | Freq: Every day | ORAL | Status: DC
  Administered 2012-07-05 – 2012-07-06 (×2): 2000 [IU] via ORAL
  Filled 2012-07-04 (×2): qty 2

## 2012-07-04 MED ORDER — ALBUTEROL SULFATE (5 MG/ML) 0.5% IN NEBU
INHALATION_SOLUTION | RESPIRATORY_TRACT | Status: AC
  Administered 2012-07-04: 2.5 mg
  Filled 2012-07-04: qty 0.5

## 2012-07-04 MED ORDER — BUPIVACAINE HCL (PF) 0.25 % IJ SOLN
INTRAMUSCULAR | Status: AC
  Filled 2012-07-04: qty 30

## 2012-07-04 MED ORDER — BUPIVACAINE-EPINEPHRINE PF 0.5-1:200000 % IJ SOLN
INTRAMUSCULAR | Status: DC | PRN
  Administered 2012-07-04: 150 mg

## 2012-07-04 MED ORDER — OXYCODONE HCL 5 MG PO TABS: 5.0000 mg | ORAL_TABLET | Freq: Once | ORAL | Status: DC | PRN

## 2012-07-04 MED ORDER — DIPHENHYDRAMINE HCL 12.5 MG/5ML PO ELIX
12.5000 mg | ORAL_SOLUTION | Freq: Four times a day (QID) | ORAL | Status: DC | PRN
  Filled 2012-07-04: qty 5

## 2012-07-04 MED ORDER — METOCLOPRAMIDE HCL 10 MG PO TABS: 5.0000 mg | ORAL_TABLET | Freq: Three times a day (TID) | ORAL | Status: DC | PRN

## 2012-07-04 MED ORDER — MORPHINE SULFATE (PF) 1 MG/ML IV SOLN
INTRAVENOUS | Status: AC
  Filled 2012-07-04: qty 25

## 2012-07-04 MED ORDER — ACETAMINOPHEN 325 MG PO TABS
650.0000 mg | ORAL_TABLET | Freq: Four times a day (QID) | ORAL | Status: DC | PRN
  Administered 2012-07-05: 650 mg via ORAL
  Filled 2012-07-04: qty 2

## 2012-07-04 MED ORDER — PANTOPRAZOLE SODIUM 40 MG PO TBEC
40.0000 mg | DELAYED_RELEASE_TABLET | Freq: Every day | ORAL | Status: DC
  Administered 2012-07-05 – 2012-07-06 (×2): 40 mg via ORAL
  Filled 2012-07-04 (×2): qty 1

## 2012-07-04 MED ORDER — METOCLOPRAMIDE HCL 5 MG/ML IJ SOLN: 5.0000 mg | Freq: Three times a day (TID) | INTRAMUSCULAR | Status: DC | PRN

## 2012-07-04 MED ORDER — HYDROMORPHONE HCL PF 1 MG/ML IJ SOLN: 0.2500 mg | INTRAMUSCULAR | Status: DC | PRN

## 2012-07-04 MED ORDER — 0.9 % SODIUM CHLORIDE (POUR BTL) OPTIME
TOPICAL | Status: DC | PRN
  Administered 2012-07-04 (×5): 1000 mL

## 2012-07-04 MED ORDER — ASPIRIN 325 MG PO TABS
325.0000 mg | ORAL_TABLET | Freq: Every day | ORAL | Status: DC
  Administered 2012-07-05: 325 mg via ORAL
  Filled 2012-07-04 (×2): qty 1

## 2012-07-04 MED ORDER — PHENOL 1.4 % MT LIQD: 1.0000 | OROMUCOSAL | Status: DC | PRN

## 2012-07-04 MED ORDER — SIMVASTATIN 20 MG PO TABS
20.0000 mg | ORAL_TABLET | Freq: Every day | ORAL | Status: DC
  Administered 2012-07-05: 20 mg via ORAL
  Filled 2012-07-04 (×3): qty 1

## 2012-07-04 MED ORDER — GLYCOPYRROLATE 0.2 MG/ML IJ SOLN
INTRAMUSCULAR | Status: DC | PRN
  Administered 2012-07-04: 0.6 mg via INTRAVENOUS
  Administered 2012-07-04: 0.1 mg via INTRAVENOUS
  Administered 2012-07-04: 0.2 mg via INTRAVENOUS

## 2012-07-04 MED ORDER — ACETAMINOPHEN 650 MG RE SUPP: 650.0000 mg | Freq: Four times a day (QID) | RECTAL | Status: DC | PRN

## 2012-07-04 MED ORDER — SUCCINYLCHOLINE CHLORIDE 20 MG/ML IJ SOLN
INTRAMUSCULAR | Status: DC | PRN
  Administered 2012-07-04: 120 mg via INTRAVENOUS

## 2012-07-04 MED ORDER — PHENYLEPHRINE HCL 10 MG/ML IJ SOLN
20.0000 mg | INTRAVENOUS | Status: DC | PRN
  Administered 2012-07-04: 50 ug/min via INTRAVENOUS

## 2012-07-04 MED ORDER — DIPHENHYDRAMINE HCL 50 MG/ML IJ SOLN: 12.5000 mg | Freq: Four times a day (QID) | INTRAMUSCULAR | Status: DC | PRN

## 2012-07-04 MED ORDER — POTASSIUM CHLORIDE IN NACL 20-0.9 MEQ/L-% IV SOLN
INTRAVENOUS | Status: AC
  Administered 2012-07-05: 19:00:00 via INTRAVENOUS
  Filled 2012-07-04 (×2): qty 1000

## 2012-07-04 MED ORDER — MENTHOL 3 MG MT LOZG: 1.0000 | LOZENGE | OROMUCOSAL | Status: DC | PRN

## 2012-07-04 MED ORDER — SODIUM CHLORIDE 0.9 % IJ SOLN: 9.0000 mL | INTRAMUSCULAR | Status: DC | PRN

## 2012-07-04 MED ORDER — HYDROCHLOROTHIAZIDE 12.5 MG PO CAPS
12.5000 mg | ORAL_CAPSULE | Freq: Every day | ORAL | Status: DC
  Administered 2012-07-05 – 2012-07-06 (×2): 12.5 mg via ORAL
  Filled 2012-07-04 (×2): qty 1

## 2012-07-04 MED ORDER — ACETAMINOPHEN 10 MG/ML IV SOLN
INTRAVENOUS | Status: AC
  Filled 2012-07-04: qty 100

## 2012-07-04 MED ORDER — FENOFIBRATE 54 MG PO TABS
54.0000 mg | ORAL_TABLET | Freq: Every day | ORAL | Status: DC
  Administered 2012-07-05 – 2012-07-06 (×2): 54 mg via ORAL
  Filled 2012-07-04 (×2): qty 1

## 2012-07-04 SURGICAL SUPPLY — 97 items
11 Hole s3 Plate- right (Plate) ×3 IMPLANT
BANDAGE ELASTIC 4 VELCRO ST LF (GAUZE/BANDAGES/DRESSINGS) IMPLANT
BANDAGE ELASTIC 6 VELCRO ST LF (GAUZE/BANDAGES/DRESSINGS) IMPLANT
BENZOIN TINCTURE PRP APPL 2/3 (GAUZE/BANDAGES/DRESSINGS) IMPLANT
BIT DRILL 2.8X4 QC CORT (BIT) ×3 IMPLANT
BIT DRILL 4 LONG FAST STEP (BIT) ×3 IMPLANT
BIT DRILL 4 SHORT FAST STEP (BIT) ×3 IMPLANT
BLADE SURG 15 STRL LF DISP TIS (BLADE) ×2 IMPLANT
BLADE SURG 15 STRL SS (BLADE) ×1
BNDG COHESIVE 4X5 TAN STRL (GAUZE/BANDAGES/DRESSINGS) ×3 IMPLANT
CLOTH BEACON ORANGE TIMEOUT ST (SAFETY) ×3 IMPLANT
COVER SURGICAL LIGHT HANDLE (MISCELLANEOUS) ×3 IMPLANT
DECANTER SPIKE VIAL GLASS SM (MISCELLANEOUS) IMPLANT
DRAIN PENROSE 1/2X12 LTX STRL (WOUND CARE) IMPLANT
DRAPE C-ARM 42X72 X-RAY (DRAPES) ×3 IMPLANT
DRAPE INCISE IOBAN 66X45 STRL (DRAPES) ×6 IMPLANT
DRAPE ORTHO SPLIT 77X108 STRL (DRAPES) ×1
DRAPE SURG ORHT 6 SPLT 77X108 (DRAPES) ×2 IMPLANT
DRAPE U-SHAPE 47X51 STRL (DRAPES) ×3 IMPLANT
DRSG MEPILEX BORDER 4X12 (GAUZE/BANDAGES/DRESSINGS) ×3 IMPLANT
DRSG MEPILEX BORDER 4X4 (GAUZE/BANDAGES/DRESSINGS) ×3 IMPLANT
DRSG PAD ABDOMINAL 8X10 ST (GAUZE/BANDAGES/DRESSINGS) IMPLANT
DURAPREP 26ML APPLICATOR (WOUND CARE) ×6 IMPLANT
ELECT CAUTERY BLADE 6.4 (BLADE) ×3 IMPLANT
ELECT REM PT RETURN 9FT ADLT (ELECTROSURGICAL) ×3
ELECTRODE REM PT RTRN 9FT ADLT (ELECTROSURGICAL) ×2 IMPLANT
FACESHIELD LNG OPTICON STERILE (SAFETY) ×3 IMPLANT
GAUZE XEROFORM 1X8 LF (GAUZE/BANDAGES/DRESSINGS) ×3 IMPLANT
GAUZE XEROFORM 5X9 LF (GAUZE/BANDAGES/DRESSINGS) IMPLANT
GLOVE BIOGEL M 7.0 STRL (GLOVE) ×3 IMPLANT
GLOVE BIOGEL PI IND STRL 6.5 (GLOVE) ×2 IMPLANT
GLOVE BIOGEL PI IND STRL 8 (GLOVE) ×2 IMPLANT
GLOVE BIOGEL PI INDICATOR 6.5 (GLOVE) ×1
GLOVE BIOGEL PI INDICATOR 8 (GLOVE) ×1
GLOVE SS BIOGEL STRL SZ 7 (GLOVE) ×4 IMPLANT
GLOVE SUPERSENSE BIOGEL SZ 7 (GLOVE) ×2
GLOVE SURG ORTHO 8.0 STRL STRW (GLOVE) ×9 IMPLANT
GLOVE SURG SS PI 7.0 STRL IVOR (GLOVE) ×6 IMPLANT
GOWN PREVENTION PLUS LG XLONG (DISPOSABLE) IMPLANT
GOWN PREVENTION PLUS XLARGE (GOWN DISPOSABLE) ×3 IMPLANT
GOWN STRL NON-REIN LRG LVL3 (GOWN DISPOSABLE) ×3 IMPLANT
GOWN STRL REIN XL XLG (GOWN DISPOSABLE) ×3 IMPLANT
IMPLANT OP-1 (Orthopedic Implant) ×3 IMPLANT
KIT BASIN OR (CUSTOM PROCEDURE TRAY) ×3 IMPLANT
KIT ROOM TURNOVER OR (KITS) ×3 IMPLANT
LOOP VESSEL MINI RED (MISCELLANEOUS) ×3 IMPLANT
MANIFOLD NEPTUNE II (INSTRUMENTS) ×3 IMPLANT
Multi-Directional Screw 3.8x 22 (Screw) ×3 IMPLANT
NEEDLE 1/2 CIR CATGUT .05X1.09 (NEEDLE) IMPLANT
NEEDLE 21X1 OR PACK (NEEDLE) IMPLANT
NEEDLE HYPO 25GX1X1/2 BEV (NEEDLE) IMPLANT
NS IRRIG 1000ML POUR BTL (IV SOLUTION) ×3 IMPLANT
PACK SHOULDER (CUSTOM PROCEDURE TRAY) ×3 IMPLANT
PAD ARMBOARD 7.5X6 YLW CONV (MISCELLANEOUS) ×6 IMPLANT
PAD CAST 4YDX4 CTTN HI CHSV (CAST SUPPLIES) IMPLANT
PADDING CAST COTTON 4X4 STRL (CAST SUPPLIES)
PASSER SUT SWANSON 36MM LOOP (INSTRUMENTS) IMPLANT
PEG STND 4.0X25.0MM (Orthopedic Implant) ×3 IMPLANT
PEG STND 4.0X32.5MM (Orthopedic Implant) ×3 IMPLANT
PEG STND 4.0X35MM (Orthopedic Implant) ×6 IMPLANT
PEG STND 4.0X45.0MM (Orthopedic Implant) ×3 IMPLANT
PEGSTD 4.0X32.5MM (Orthopedic Implant) ×2 IMPLANT
PEGSTD 4.0X35MM (Orthopedic Implant) ×4 IMPLANT
PEGSTD 4.0X45.0MM (Orthopedic Implant) ×2 IMPLANT
PENCIL BUTTON HOLSTER BLD 10FT (ELECTRODE) ×3 IMPLANT
PIN GUIDE SHOULDER 2.0MM (PIN) ×6 IMPLANT
SCREW LOCK 90D ANGLED 3.8X22 (Screw) ×12 IMPLANT
SCREW LOCK 90D ANGLED 3.8X24 (Screw) ×3 IMPLANT
SCREW LOCK 90D ANGLED 3.8X26 (Screw) ×12 IMPLANT
SCREW LOCK 90D ANGLED 3.8X28 (Screw) ×3 IMPLANT
SCREW LOCK 90D ANGLED 3.8X30 (Screw) ×3 IMPLANT
SCREW LOCK 90D ANGLED 3.8X34 (Screw) ×3 IMPLANT
SCREW LOCK 90D ANGLED 3.8X36 (Screw) ×3 IMPLANT
SCREW MULTIDIR 3.8X24 HUMRL (Screw) IMPLANT
SPONGE GAUZE 4X4 12PLY (GAUZE/BANDAGES/DRESSINGS) IMPLANT
SPONGE LAP 18X18 X RAY DECT (DISPOSABLE) ×3 IMPLANT
SPONGE LAP 4X18 X RAY DECT (DISPOSABLE) ×6 IMPLANT
STAPLER VISISTAT 35W (STAPLE) IMPLANT
STOCKINETTE IMPERVIOUS 9X36 MD (GAUZE/BANDAGES/DRESSINGS) ×3 IMPLANT
SUCTION FRAZIER TIP 10 FR DISP (SUCTIONS) ×3 IMPLANT
SUT FIBERWIRE #2 38 T-5 BLUE (SUTURE) ×9
SUT FIBERWIRE 2-0 18 17.9 3/8 (SUTURE) ×3
SUT VIC AB 0 CT1 27 (SUTURE) ×4
SUT VIC AB 0 CT1 27XBRD ANBCTR (SUTURE) ×8 IMPLANT
SUT VIC AB 1 CTX 27 (SUTURE) IMPLANT
SUT VIC AB 2-0 CT1 27 (SUTURE) ×2
SUT VIC AB 2-0 CT1 TAPERPNT 27 (SUTURE) ×4 IMPLANT
SUT VIC AB 2-0 CTB1 (SUTURE) ×6 IMPLANT
SUTURE FIBERWR #2 38 T-5 BLUE (SUTURE) ×6 IMPLANT
SUTURE FIBERWR 2-0 18 17.9 3/8 (SUTURE) ×2 IMPLANT
SYR CONTROL 10ML LL (SYRINGE) IMPLANT
TOWEL OR 17X24 6PK STRL BLUE (TOWEL DISPOSABLE) ×3 IMPLANT
TOWEL OR 17X26 10 PK STRL BLUE (TOWEL DISPOSABLE) ×6 IMPLANT
TRAY FOLEY CATH 14FR (SET/KITS/TRAYS/PACK) ×3 IMPLANT
TUBE CONNECTING 12X1/4 (SUCTIONS) IMPLANT
WATER STERILE IRR 1000ML POUR (IV SOLUTION) IMPLANT
YANKAUER SUCT BULB TIP NO VENT (SUCTIONS) IMPLANT

## 2012-07-04 NOTE — Anesthesia Preprocedure Evaluation (Addendum)
Anesthesia Evaluation  Patient identified by MRN, date of birth, ID band Patient awake    Reviewed: Allergy & Precautions, H&P , NPO status , Patient's Chart, lab work & pertinent test results  History of Anesthesia Complications (+) PONV  Airway Mallampati: III TM Distance: >3 FB Neck ROM: Full    Dental  (+) Teeth Intact and Dental Advisory Given   Pulmonary asthma , pneumonia -, resolved,    Pulmonary exam normal       Cardiovascular hypertension, Pt. on medications     Neuro/Psych  Headaches,  Neuromuscular disease negative psych ROS   GI/Hepatic Neg liver ROS, PUD, GERD-  Medicated,  Endo/Other  diabetesMorbid obesity  Renal/GU negative Renal ROS     Musculoskeletal   Abdominal   Peds  Hematology   Anesthesia Other Findings   Reproductive/Obstetrics                          Anesthesia Physical Anesthesia Plan  ASA: III  Anesthesia Plan: General   Post-op Pain Management:    Induction: Intravenous  Airway Management Planned: Oral ETT  Additional Equipment:   Intra-op Plan:   Post-operative Plan: Extubation in OR  Informed Consent: I have reviewed the patients History and Physical, chart, labs and discussed the procedure including the risks, benefits and alternatives for the proposed anesthesia with the patient or authorized representative who has indicated his/her understanding and acceptance.   Dental advisory given  Plan Discussed with: CRNA, Anesthesiologist and Surgeon  Anesthesia Plan Comments:        Anesthesia Quick Evaluation

## 2012-07-04 NOTE — Progress Notes (Signed)
Pt spoke with husband by phone.

## 2012-07-04 NOTE — Brief Op Note (Signed)
07/04/2012  2:19 PM  PATIENT:  Nance Pear  57 y.o. female  PRE-OPERATIVE DIAGNOSIS:  Right Shoulder Rotator Cuff Tear/Humeral Nonunion  POST-OPERATIVE DIAGNOSIS:  Right Shoulder Rotator Cuff Tear/Humeral Nonunion  PROCEDURE:  Procedure(s): ROTATOR CUFF REPAIR SHOULDER OPEN OPEN REDUCTION INTERNAL FIXATION (ORIF) PROXIMAL HUMERUS FRACTURE  SURGEON:  Surgeon(s): Cammy Copa, MD  ASSISTANT: Skip Mayer  ANESTHESIA:   general  EBL: 350 ml    Total I/O In: 2750 [I.V.:2000; IV Piggyback:750] Out: 630 [Urine:280; Blood:350]  BLOOD ADMINISTERED: none  DRAINS: none   LOCAL MEDICATIONS USED:  none  SPECIMEN:  No Specimen  COUNTS:  YES  TOURNIQUET:  * No tourniquets in log *  DICTATION: .Other Dictation: Dictation Number done no number phone cut off  PLAN OF CARE: Admit to inpatient   PATIENT DISPOSITION:  PACU - hemodynamically stable

## 2012-07-04 NOTE — Anesthesia Postprocedure Evaluation (Signed)
  Anesthesia Post-op Note  Patient: Tara [name]  Procedure(s) Performed: Procedure(s) with comments: ROTATOR CUFF REPAIR SHOULDER OPEN (Right) - Right Shoulder Open Rotator Cuff Repair/Humeral Plating. OPEN REDUCTION INTERNAL FIXATION (ORIF) PROXIMAL HUMERUS FRACTURE (Right)  Patient Location: PACU  Anesthesia Type:General  Level of Consciousness: awake  Airway and Oxygen Therapy: Patient Spontanous Breathing  Post-op Pain: mild  Post-op Assessment: Post-op Vital signs reviewed  Post-op Vital Signs: Reviewed  Complications: No apparent anesthesia complications

## 2012-07-04 NOTE — Transfer of Care (Signed)
Immediate Anesthesia Transfer of Care Note  Patient: Tara [name]  Procedure(s) Performed: Procedure(s) with comments: ROTATOR CUFF REPAIR SHOULDER OPEN (Right) - Right Shoulder Open Rotator Cuff Repair/Humeral Plating. OPEN REDUCTION INTERNAL FIXATION (ORIF) PROXIMAL HUMERUS FRACTURE (Right)  Patient Location: PACU  Anesthesia Type:General  Level of Consciousness: awake, alert  and oriented  Airway & Oxygen Therapy: Patient Spontanous Breathing and Patient connected to face mask oxygen  Post-op Assessment: Report given to PACU RN and Post -op Vital signs reviewed and stable  Post vital signs: Reviewed and stable  Complications: No apparent anesthesia complications

## 2012-07-04 NOTE — Anesthesia Procedure Notes (Addendum)
Anesthesia Regional Block:  Interscalene brachial plexus block  Pre-Anesthetic Checklist: ,, timeout performed, Correct Patient, Correct Site, Correct Laterality, Correct Procedure, Correct Position, site marked, Risks and benefits discussed,  Surgical consent,  Pre-op evaluation,  At surgeon's request and post-op pain management  Laterality: Right  Prep: chloraprep       Needles:  Injection technique: Single-shot  Needle Type: Echogenic Stimulator Needle     Needle Length: 5cm 5 cm Needle Gauge: 22 and 22 G    Additional Needles:  Procedures: ultrasound guided (picture in chart) and nerve stimulator Interscalene brachial plexus block  Nerve Stimulator or Paresthesia:  Response: bicep contraction, 0.45 mA,   Additional Responses:   Narrative:  Start time: 07/04/2012 7:17 AM End time: 07/04/2012 7:27 AM Injection made incrementally with aspirations every 5 mL.  Performed by: Personally  Anesthesiologist: J. Adonis Huguenin, MD  Additional Notes: Functioning IV was confirmed and monitors applied.  A 50mm 22ga echogenic arrow stimulator was used. Sterile prep and drape,hand hygiene and sterile gloves were used.Ultrasound guidance: relevant anatomy identified, needle position confirmed, local anesthetic spread visualized around nerve(s)., vascular puncture avoided.  Image printed for medical record.  Negative aspiration and negative test dose prior to incremental administration of local anesthetic. The patient tolerated the procedure well.  Interscalene brachial plexus block Procedure Name: Intubation Date/Time: 07/04/2012 7:49 AM Performed by: Gayla Medicus Pre-anesthesia Checklist: Patient identified, Timeout performed, Emergency Drugs available, Suction available and Patient being monitored Patient Re-evaluated:Patient Re-evaluated prior to inductionOxygen Delivery Method: Circle system utilized Preoxygenation: Pre-oxygenation with 100% oxygen Intubation Type: IV  induction Laryngoscope Size: Mac and 3 Grade View: Grade I Tube type: Oral Tube size: 7.0 mm Number of attempts: 1 Airway Equipment and Method: Patient positioned with wedge pillow and Stylet Placement Confirmation: ETT inserted through vocal cords under direct vision,  positive ETCO2,  CO2 detector and breath sounds checked- equal and bilateral Secured at: 22 cm Tube secured with: Tape Dental Injury: Teeth and Oropharynx as per pre-operative assessment

## 2012-07-04 NOTE — Progress Notes (Signed)
Pt upset over bed wait. Explained things had changed when Dr. August Saucer changed the bed to with telly. Upset over how long process is taking

## 2012-07-04 NOTE — Preoperative (Signed)
Beta Blockers   Reason not to administer Beta Blockers: not prescribed 

## 2012-07-04 NOTE — Progress Notes (Signed)
Pt instructed in teach back with PCA, states starting to feel tingling in fingers given PCa button

## 2012-07-04 NOTE — Interval H&P Note (Signed)
History and Physical Interval Note:  07/04/2012 7:38 AM  Tara [name]  has presented today for surgery, with the diagnosis of Right Shoulder Rotator Cuff Tear/Humeral Nonunion  The various methods of treatment have been discussed with the patient and family. After consideration of risks, benefits and other options for treatment, the patient has consented to  Procedure(s) with comments: ROTATOR CUFF REPAIR SHOULDER OPEN (Right) - Right Shoulder Open Rotator Cuff Repair/Humeral Plating. OPEN REDUCTION INTERNAL FIXATION (ORIF) PROXIMAL HUMERUS FRACTURE (Right) as a surgical intervention .  The patient's history has been reviewed, patient examined, no change in status, stable for surgery.  I have reviewed the patient's chart and labs.  Questions were answered to the patient's satisfaction.     DEAN,GREGORY SCOTT

## 2012-07-04 NOTE — Progress Notes (Signed)
Pt had diet coke and peanut butter and crackers

## 2012-07-04 NOTE — Progress Notes (Signed)
This note also relates to the following rows which could not be included: Pulse Rate - Cannot attach notes to unvalidated device data ECG Heart Rate - Cannot attach notes to unvalidated device data Resp - Cannot attach notes to unvalidated device data SpO2 - Cannot attach notes to unvalidated device data   Pt very upset over bed wait explained surgeon changed her to a telly bed and it changed the bed wait.

## 2012-07-05 ENCOUNTER — Encounter (HOSPITAL_COMMUNITY): Payer: Self-pay | Admitting: *Deleted

## 2012-07-05 DIAGNOSIS — M75121 Complete rotator cuff tear or rupture of right shoulder, not specified as traumatic: Secondary | ICD-10-CM | POA: Diagnosis present

## 2012-07-05 DIAGNOSIS — S42341G Displaced spiral fracture of shaft of humerus, right arm, subsequent encounter for fracture with delayed healing: Secondary | ICD-10-CM

## 2012-07-05 LAB — GLUCOSE, CAPILLARY
Glucose-Capillary: 109 mg/dL — ABNORMAL HIGH (ref 70–99)
Glucose-Capillary: 113 mg/dL — ABNORMAL HIGH (ref 70–99)
Glucose-Capillary: 107 mg/dL — ABNORMAL HIGH (ref 70–99)
Glucose-Capillary: 106 mg/dL — ABNORMAL HIGH (ref 70–99)

## 2012-07-05 LAB — CBC
MCH: 26.6 pg (ref 26.0–34.0)
MCHC: 33.4 g/dL (ref 30.0–36.0)
Platelets: 234 10*3/uL (ref 150–400)
RBC: 3.64 MIL/uL — ABNORMAL LOW (ref 3.87–5.11)

## 2012-07-05 LAB — BASIC METABOLIC PANEL
Calcium: 9.3 mg/dL (ref 8.4–10.5)
GFR calc non Af Amer: >90 mL/min (ref >90)
Sodium: 142 mEq/L (ref 135–145)

## 2012-07-05 MED ORDER — MORPHINE SULFATE (PF) 1 MG/ML IV SOLN
INTRAVENOUS | Status: DC
  Administered 2012-07-05: 8 mg via INTRAVENOUS

## 2012-07-05 NOTE — Progress Notes (Signed)
UR completed 

## 2012-07-05 NOTE — H&P (Signed)
NAMEMAKENLY, LARABEE             ACCOUNT NO.:  [id number]  MEDICAL RECORD NO.:  [id number]  LOCATION:                                 FACILITY:  PHYSICIAN:  Burnard Bunting, M.D.    DATE OF BIRTH:  [birth year]  DATE OF ADMISSION: DATE OF DISCHARGE:                             HISTORY & PHYSICAL   CHIEF COMPLAINT:  Right humerus pain and shoulder pain.  HISTORY OF PRESENT ILLNESS:  Tara [name] is a 57 year old patient who sustained a workman's comp injury on January 13, 2012.  She actually fell down some stairs on to her right wrist, humerus, and shoulder.  She underwent right wrist open reduction, internal fixation at that time for a very complex distal radius fracture.  She did recover from that.  She also had a humeral shaft fracture which was comminuted and initially healed with some stability, but in the course of therapy developed a symptomatic nonunion.  The patient also has had two previous rotator cuff surgeries on the shoulder.  She has had an MRI scan which does show recurrent rotator cuff tear in the shoulder as well as persistent motion at the fracture site.  CURRENT MEDICATIONS:  Lisinopril, Simcor, metformin, omeprazole, and aspirin.  ALLERGIES:  She is allergic to PENICILLIN, KEFLEX.  PAST SURGICAL HISTORY:  She has had C-section, hysterectomy, right shoulder surgery in 2012, rotator cuff surgery again in 2013.  FAMILY MEDICAL HISTORY:  Positive for diabetes, cancer.  No family history of DVT or pulmonary embolism.  SOCIAL HISTORY:  She is married.  Does not smoke.  Occasionally drinks.  REVIEW OF SYSTEMS:  All other systems reviewed are negative as they relate to the right shoulder.  PHYSICAL EXAMINATION:  GENERAL:  She is well developed, well nourished; in no acute distress.  Alert and oriented.  Increased body mass index. CHEST:  Clear to auscultation. HEART:  Regular rate and rhythm. ABDOMEN:  Benign. EXTREMITIES:  Right shoulder has a  well-healed surgical incision from mini open rotator cuff tear repair.  She also has decreased range of motion at the fracture site in the proximal humerus area.  Hand has some swelling, but is improving in terms of wrist range of motion with pronation supination.  Elbow range of motion is full.  Radial pulse is intact.  An MRI arthrogram obtained January 2014 demonstrate nonunion of the fracture site and probable re-tear of the anterior distal supraspinatus and partial tear extending to the rotator interval into the upper subscapularis.  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  PLAN:  Open reduction, internal fixation of humeral shaft nonunion with plating.  Also, repair of the rotator cuff.  Risks and benefits were discussed with the patient including, but not limited to infection, nerve and vessel damage, shoulder stiffness, persistent shoulder weakness.  The patient understood the risks, benefits, and wished to proceed.  All questions answered.     Burnard Bunting, M.D.     GSD/MEDQ  D:  07/03/2012  T:  07/04/2012  Job:  [id number]

## 2012-07-05 NOTE — Evaluation (Signed)
Occupational Therapy Evaluation Patient Details Name: Tara [name] MRN: [id number] DOB: 1956-01-23 Today's Date: 07/05/2012 Time: 7829-5621 OT Time Calculation (min): 46 min  OT Assessment / Plan / Recommendation Clinical Impression  Pt is aq 57 yo female admitted for ORIF to R shoulder with RC repair due to porximal humerous shaft fx who has the deficits listed below.  Pt would benfit from cont acute OT to review pendulum exercises before returning home.      OT Assessment  Patient needs continued OT Services    Follow Up Recommendations  Outpatient OT;Other (comment) (as recommended by ortho MD)    Barriers to Discharge None husband available 24/7  Equipment Recommendations       Recommendations for Other Services    Frequency  Min 2X/week    Precautions / Restrictions Precautions Precautions: Shoulder Type of Shoulder Precautions: NWB.  Sling for comfort Precaution Booklet Issued: No Required Braces or Orthoses: Other Brace/Splint Other Brace/Splint: sling for R arm. Restrictions Weight Bearing Restrictions: Yes RUE Weight Bearing: Non weight bearing   Pertinent Vitals/Pain Pt with 4/10 pain.  O2 sats in 90s on RA    ADL  Eating/Feeding: Performed;Set up Where Assessed - Eating/Feeding: Chair Grooming: Performed;Wash/dry hands;Wash/dry face;Set up Where Assessed - Grooming: Unsupported standing Upper Body Bathing: Simulated;Minimal assistance Where Assessed - Upper Body Bathing: Unsupported sitting Lower Body Bathing: Simulated;Moderate assistance Where Assessed - Lower Body Bathing: Unsupported sit to stand Upper Body Dressing: Performed;Minimal assistance Where Assessed - Upper Body Dressing: Unsupported sitting Lower Body Dressing: Performed;Minimal assistance Where Assessed - Lower Body Dressing: Unsupported sit to stand Toilet Transfer: Performed;Supervision/safety Toilet Transfer Method: Sit to Barista: Comfort height  toilet;Grab bars Toileting - Clothing Manipulation and Hygiene: Performed;Min guard Where Assessed - Engineer, mining and Hygiene: Standing Transfers/Ambulation Related to ADLs: Pt walked with S down hall and in room ADL Comments: Pt has had pain in R shoulder for 6 months now and is very familiar with adl techniques for bathing and dressing.    OT Diagnosis: Generalized weakness;Acute pain  OT Problem List: Decreased strength;Decreased range of motion;Decreased coordination;Pain;Impaired UE functional use OT Treatment Interventions: Therapeutic exercise;Self-care/ADL training   OT Goals Acute Rehab OT Goals OT Goal Formulation: With patient Time For Goal Achievement: 07/12/12 Potential to Achieve Goals: Good ADL Goals Pt Will Perform Upper Body Dressing: with modified independence;Sitting, chair;Other (comment) (including sling) ADL Goal: Upper Body Dressing - Progress: Goal set today Pt Will Perform Lower Body Dressing: with modified independence;Sit to stand from chair ADL Goal: Lower Body Dressing - Progress: Goal set today Additional ADL Goal #1: Pt will toilet with mod I. ADL Goal: Additional Goal #1 - Progress: Goal set today Arm Goals Additional Arm Goal #1: Pt will be independent with R pendulum exercises. Arm Goal: Additional Goal #1 - Progress: Goal set today  Visit Information  Last OT Received On: 07/05/12 Assistance Needed: +1    Subjective Data  Subjective: "I feel good all things considered." Patient Stated Goal: to get up and move.   Prior Functioning     Home Living Lives With: Spouse Available Help at Discharge: Available 24 hours/day Type of Home: Other (Comment) (condo) Home Access: Stairs to enter Entrance Stairs-Number of Steps: 4 Entrance Stairs-Rails: Right Home Layout: One level Bathroom Shower/Tub: Walk-in shower;Door Bathroom Toilet: Handicapped height Bathroom Accessibility: Yes How Accessible: Accessible via walker Home  Adaptive Equipment: Built-in shower seat;Grab bars around toilet;Grab bars in shower;Other (comment);Bedside commode/3-in-1;Walker - rolling (grab rail on bed)  Prior Function Level of Independence: Needs assistance Needs Assistance: Dressing;Light Housekeeping;Meal Prep Dressing: Minimal Meal Prep: Maximal Light Housekeeping: Maximal Able to Take Stairs?: Yes Driving: No (last time drove was Sept.) Vocation: Retired Musician: No difficulties Dominant Hand: Right         Vision/Perception Vision - History Baseline Vision: No visual deficits Patient Visual Report: No change from baseline Vision - Assessment Vision Assessment: Vision not tested   [employer] Overall Cognitive Status: Appears within functional limits for tasks assessed/performed Arousal/Alertness: Awake/alert Orientation Level: Oriented X4 / Intact Behavior During Session: WFL for tasks performed Cognition - Other Comments: intact    Extremity/Trunk Assessment Right Upper Extremity Assessment RUE ROM/Strength/Tone: Due to precautions;Due to pain;Unable to fully assess RUE Sensation: WFL - Light Touch RUE Coordination: Deficits RUE Coordination Deficits: due to old wrist fracture and gross motor deficits due to recent surgery to shoulder Left Upper Extremity Assessment LUE ROM/Strength/Tone: Within functional levels LUE Sensation: WFL - Light Touch LUE Coordination: WFL - gross/fine motor Trunk Assessment Trunk Assessment: Normal     Mobility Bed Mobility Bed Mobility: Supine to Sit;Sitting - Scoot to Edge of Bed Supine to Sit: HOB elevated;5: Supervision (HOB at 60 degress) Sitting - Scoot to Edge of Bed: 6: Modified independent (Device/Increase time) Details for Bed Mobility Assistance: does well w all bed mobility. Wants to be very I Transfers Transfers: Sit to Stand;Stand to Sit Sit to Stand: 5: Supervision Stand to Sit: 5: Supervision Details for Transfer Assistance:  Pt very safe with all transfers.  Only limited by lines     Exercise Shoulder Exercises Pendulum Exercise: PROM;10 reps;Standing Elbow Flexion: AROM;10 reps;Seated Elbow Extension: AROM;10 reps;Seated Wrist Flexion: AROM;10 reps;Seated Wrist Extension: AROM;10 reps;Seated Digit Composite Flexion: AROM;10 reps;Seated Composite Extension: AROM;10 reps;Seated Donning/doffing shirt without moving shoulder: Supervision/safety Method for sponge bathing under operated UE: Supervision/safety Donning/doffing sling/immobilizer: Supervision/safety Correct positioning of sling/immobilizer: Supervision/safety Pendulum exercises (written home exercise program): Supervision/safety ROM for elbow, wrist and digits of operated UE: Supervision/safety Sling wearing schedule (on at all times/off for ADL's): Independent Proper positioning of operated UE when showering: Independent Positioning of UE while sleeping: Independent   Balance Balance Balance Assessed: No   End of Session OT - End of Session Activity Tolerance: Patient tolerated treatment well Patient left: in chair;with call bell/phone within reach;with family/visitor present Nurse Communication: Mobility status  GO Functional Assessment Tool Used: clinical observation Functional Limitation: Self care Self Care Current Status (Z6109): At least 1 percent but less than 20 percent impaired, limited or restricted Self Care Goal Status (U0454): At least 1 percent but less than 20 percent impaired, limited or restricted   Hope Budds 07/05/2012, 10:19 AM [phone]

## 2012-07-05 NOTE — Progress Notes (Signed)
Subjective: Pt stable - block starting to wear off   Objective: Vital signs in last 24 hours: Temp:  [97.2 F (36.2 C)-98.8 F (37.1 C)] 98.8 F (37.1 C) (03/05 0656) Pulse Rate:  [93-101] 101 (03/05 0256) Resp:  [18-25] 22 (03/05 0821) BP: (109-143)/(42-70) 127/60 mmHg (03/05 0656) SpO2:  [90 %-98 %] 96 % (03/05 0821) FiO2 (%):  [95 %] 95 % (03/05 0044) Weight:  [127.7 kg (281 lb 8.4 oz)] 127.7 kg (281 lb 8.4 oz) (03/05 0325)  Intake/Output from previous day: 03/04 0701 - 03/05 0700 In: 3750 [I.V.:3000; IV Piggyback:750] Out: 4780 [Urine:4430; Blood:350] Intake/Output this shift:    Exam:  Intact pulses distally No cellulitis present Compartment soft  Labs:  Recent Labs  07/05/12 0615  HGB 9.7*    Recent Labs  07/05/12 0615  WBC 14.0*  RBC 3.64*  HCT 29.0*  PLT 234    Recent Labs  07/05/12 0615  NA 142  K 3.7  CL 104  CO2 25  BUN 8  CREATININE 0.55  GLUCOSE 118*  CALCIUM 9.3   No results found for this basename: LABPT, INR,  in the last 72 hours  Assessment/Plan: Dc pca later today - dc telemetry - begin percocet 10 - start ot for shoulder pendulums   Gaylon Bentz SCOTT 07/05/2012, 8:37 AM

## 2012-07-05 NOTE — Op Note (Signed)
NAMEDERRY, ARBOGAST             ACCOUNT NO.:  [id number]  MEDICAL RECORD NO.:  [id number]  LOCATION:  6N25C                        FACILITY:  [facility]  PHYSICIAN:  Burnard Bunting, M.D.    DATE OF BIRTH:  1955/12/06  DATE OF PROCEDURE: DATE OF DISCHARGE:                              OPERATIVE REPORT   PREOPERATIVE DIAGNOSIS:  Right shoulder, proximal humerus fracture, malunion/nonunion, and rotator cuff tear.  POSTOPERATIVE DIAGNOSIS:  Right shoulder, proximal humerus fracture, malunion/nonunion, and rotator cuff tear.  PROCEDURE:  Right shoulder nonunion takedown with open reduction, internal fixation of proximal humeral shaft fracture and rotator cuff repair.  SURGEON:  Burnard Bunting, M.D.  ASSISTANT:  Carlena Sax roberts pa_________  ANESTHESIA:  General endotracheal.  ESTIMATED BLOOD LOSS:  350.  DRAINS:  None.  INDICATIONS:  Tara [name] is a patient with right humeral shaft nonunion, presents for operative management after explanation of risks and benefits.  Her injury was approximately 6 months ago.  She has failed nonoperative management.  PROCEDURE IN DETAIL:  The patient was brought to operating room, where general endotracheal anesthesia was induced.  Preoperative antibiotics were administered.  Time-out was called.  Right shoulder, arm, hand, and axilla were pre-scrubbed with alcohol and Betadine, which was allowed to dry, prepped with DuraPrep solution, draped in a sterile manner.  Tara [name] was used to cover the operative field.  A time-out was called.  The patient was morbidly obese, this added to the complexity and difficulty of the case.  Incision was made just over the coracoid process extending down 10 cm proximal to the elbow flexion crease.  Skin and subcutaneous tissue was sharply divided.  Deltopectoral interval was identified.  The patient did not have a cephalic vein __________ plane was developed. __________ Part of the fragment was attached to her pectoralis  tendon. The pectoralis tendon had to be divided in order to facilitate exposure. The biceps was mobilized medially.  The brachialis was divided and periosteal elevation was performed.  The patient had significant scar tissue and fibrinous tissue between the fracture ends.  Each of the fracture ends was mobilized and the tissue was removed.  Care was taken to avoid injury to the radial nerve posteriorly.  Both the canals were entered with a drill bit.  Both ends of the bone were curetted. Multiple attempts at reduction were made, however, because of the length of the duration of nonunion, the bones required shortening.  Thus each one was short by about a centimeter in order to facilitate plate placement and fracture reduction.  The proximal fracture__________ healed with a  malunited piece, which was on the lateral aspect of the bicipital groove.  This piece had essentially flipped 180 degrees but was flat enough to accept the plate and it was not taken down.  The plate from [employer] was then applied to the head under fluoroscopic guidance, correct placement over the greater tuberosity just lateral to the bicipital groove and the biceps tendon was performed.  It was held down with the screw.  Fracture reduction was then performed with some difficulty. Several sites of fixation were then achieved.  The proximal and distal holes were then filled with screws to achieve a good  fixation both proximally and distally.  Thorough irrigation was then performed __________ OP-1 and native bone graft from callus, which was removed, was then placed around the fracture site.  First OP-1 was placed within the fracture site on both sides.  Then, the native bone graft was placed.  All in all solid construct was created.  Correct placement of screws was noted in the AP and lateral planes under fluoroscopy.  No penetration into the head was noted.  The rotator cuff tear was then identified.  It was a very small  tear that could be closed with 2-0 FiberWire suture, which was then brought and attached to the top of the plate.  Thorough irrigation was performed.  The pectoralis tendon was repaired with 2-0 FiberWire.  The skin was then closed using interrupted inverted 0 Vicryl suture _2 - 0 vicryl_________ and skin staples.  Mepilex and shoulder immobilizer applied __________ assistance required during the case was a medical necessity for retraction, limb positioning, mobilization, and screw placement.  __________ assistance was a medical necessity especially because of the size of the patient.  The patient tolerated the procedure well without immediate complications, transferred to recovery room in stable condition.     Burnard Bunting, M.D.     GSD/MEDQ  D:  07/04/2012  T:  07/05/2012  Job:  [id number]

## 2012-07-06 LAB — GLUCOSE, CAPILLARY: Glucose-Capillary: 135 mg/dL — ABNORMAL HIGH (ref 70–99)

## 2012-07-06 MED ORDER — METHOCARBAMOL 500 MG PO TABS: 500.0000 mg | ORAL_TABLET | Freq: Three times a day (TID) | ORAL | Status: DC

## 2012-07-06 MED ORDER — OXYCODONE-ACETAMINOPHEN 10-325 MG PO TABS: 1.0000 | ORAL_TABLET | Freq: Four times a day (QID) | ORAL | Status: DC | PRN

## 2012-07-06 NOTE — Progress Notes (Signed)
In to see pt for treatment session for RUE pendulums and any other questions she may have about self care given humeral/shoulder surgery. Pt reports this is her 3rd shoulder and she feels perfectly fine with how to do pendulums as well as how to take care of herself with her husbands help given limitations of RUE for now. Treatment session not completed and acute OT will sign off.  Ignacia Palma, OTR/L [phone] 07/06/2012

## 2012-07-06 NOTE — Progress Notes (Signed)
Pt stable vss tol OT well Plan dc today

## 2012-07-06 NOTE — Care Management Note (Signed)
  Page 1 of 1   07/06/2012     11:14:05 AM   CARE MANAGEMENT NOTE 07/06/2012  Patient:  Tara [name], Tara [name]   Account Number:  [id number]  Date Initiated:  07/06/2012  Documentation initiated by:  Ronny Flurry  Subjective/Objective Assessment:     Action/Plan:   Anticipated DC Date:  07/06/2012   Anticipated DC Plan:  HOME W HOME HEALTH SERVICES         Choice offered to / List presented to:             Status of service:   Medicare Important Message given?   (If response is "NO", the following Medicare IM given date fields will be blank) Date Medicare IM given:   Date Additional Medicare IM given:    Discharge Disposition:    Per UR Regulation:    If discussed at Long Length of Stay Meetings, dates discussed:    Comments:  06-08-12 Patient discharging today with need of home health PT / OT . Patient is Worker's Comp case.  Contacted Thomasene Lot , Nurse Liason with Worker's Comp [facility] [phone] [facility] left message ) , faxed home health orders to Ms Kateri Plummer. Ms Kateri Plummer confirmed she received orders for home health PT / OT . Ms Kateri Plummer stated Worker's Comp will set up home health and call the patient directly with agency who will be providing home health services.  Ms Kateri Plummer aware patient is discharging to home today .  Patient aware.  Ronny Flurry RN BSN [phone]

## 2012-07-08 LAB — POCT I-STAT 4, (NA,K, GLUC, HGB,HCT): Hemoglobin: 10.9 g/dL — ABNORMAL LOW (ref 12.0–15.0)

## 2012-07-10 NOTE — Discharge Summary (Signed)
Physician Discharge Summary  Patient ID: Tara [name] MRN: [id number] DOB/AGE: 12/07/1955 57 y.o.  Admit date: 07/04/2012 Discharge date: 07/06/2012 Admission Diagnoses:  Principal Problem:   Displaced spiral fracture of shaft of right humerus with delayed healing Active Problems:   Complete tear of right rotator cuff   Discharge Diagnoses:  Same  Surgeries: Procedure(s): ROTATOR CUFF REPAIR SHOULDER OPEN OPEN REDUCTION INTERNAL FIXATION (ORIF) PROXIMAL HUMERUS FRACTURE on 07/04/2012   Consultants:    Discharged Condition: Stable  Hospital Course: Tara [name] is an 57 y.o. female who was admitted 07/04/2012 with a chief complaint of right shoulder pain, and found to have a diagnosis of Displaced spiral fracture of shaft of right humerus with delayed healing.  They were brought to the operating room on 07/04/2012 and underwent the above named procedures She tolerated the procedure well and pain was controlled at time of dc.    Antibiotics given:  Anti-infectives   Start     Dose/Rate Route Frequency Ordered Stop   07/05/12 0000  clindamycin (CLEOCIN) IVPB 600 mg     600 mg 100 mL/hr over 30 Minutes Intravenous Every 8 hours 07/04/12 2314 07/05/12 0850   07/04/12 0600  clindamycin (CLEOCIN) IVPB 900 mg     900 mg 100 mL/hr over 30 Minutes Intravenous On call to O.R. 07/03/12 1429 07/04/12 0751    .  Recent vital signs:  Filed Vitals:   07/06/12 0619  BP: 114/54  Pulse: 61  Temp: 98.8 F (37.1 C)  Resp: 20    Recent laboratory studies:  Results for orders placed during the hospital encounter of 07/04/12  GLUCOSE, CAPILLARY      Result Value Range   Glucose-Capillary 108 (*) 70 - 99 mg/dL  GLUCOSE, CAPILLARY      Result Value Range   Glucose-Capillary 173 (*) 70 - 99 mg/dL  GLUCOSE, CAPILLARY      Result Value Range   Glucose-Capillary 137 (*) 70 - 99 mg/dL  CBC      Result Value Range   WBC 14.0 (*) 4.0 - 10.5 K/uL   RBC 3.64 (*) 3.87 - 5.11 MIL/uL   Hemoglobin 9.7 (*) 12.0 - 15.0 g/dL   HCT 13.0 (*) 86.5 - 78.4 %   MCV 79.7  78.0 - 100.0 fL   MCH 26.6  26.0 - 34.0 pg   MCHC 33.4  30.0 - 36.0 g/dL   RDW 69.6 (*) 29.5 - 28.4 %   Platelets 234  150 - 400 K/uL  BASIC METABOLIC PANEL      Result Value Range   Sodium 142  135 - 145 mEq/L   Potassium 3.7  3.5 - 5.1 mEq/L   Chloride 104  96 - 112 mEq/L   CO2 25  19 - 32 mEq/L   Glucose, Bld 118 (*) 70 - 99 mg/dL   BUN 8  6 - 23 mg/dL   Creatinine, Ser 1.32  0.50 - 1.10 mg/dL   Calcium 9.3  8.4 - 44.0 mg/dL   GFR calc non Af Amer >90  >90 mL/min   GFR calc Af Amer >90  >90 mL/min  GLUCOSE, CAPILLARY      Result Value Range   Glucose-Capillary 109 (*) 70 - 99 mg/dL   Comment 1 Notify RN     Comment 2 Documented in Chart    GLUCOSE, CAPILLARY      Result Value Range   Glucose-Capillary 113 (*) 70 - 99 mg/dL   Comment 1 Notify RN  Comment 2 Documented in Chart    GLUCOSE, CAPILLARY      Result Value Range   Glucose-Capillary 107 (*) 70 - 99 mg/dL   Comment 1 Notify RN     Comment 2 Documented in Chart    GLUCOSE, CAPILLARY      Result Value Range   Glucose-Capillary 106 (*) 70 - 99 mg/dL  GLUCOSE, CAPILLARY      Result Value Range   Glucose-Capillary 135 (*) 70 - 99 mg/dL   Comment 1 Notify RN    POCT I-STAT 4, (NA,K, GLUC, HGB,HCT)      Result Value Range   Sodium 137  135 - 145 mEq/L   Potassium 4.8  3.5 - 5.1 mEq/L   Glucose, Bld 140 (*) 70 - 99 mg/dL   HCT 16.1 (*) 09.6 - 04.5 %   Hemoglobin 10.9 (*) 12.0 - 15.0 g/dL    Discharge Medications:     Medication List    STOP taking these medications       diphenhydramine-acetaminophen 25-500 MG Tabs  Commonly known as:  TYLENOL PM      TAKE these medications       ANTARA 130 MG capsule  Generic drug:  fenofibrate micronized  Take 130 mg by mouth every evening.     aspirin 325 MG tablet  Take 325 mg by mouth daily.     calcium carbonate 600 MG Tabs  Commonly known as:  OS-CAL  Take 600 mg by mouth 2  (two) times daily with a meal.     ibuprofen 600 MG tablet  Commonly known as:  ADVIL,MOTRIN  Take 600 mg by mouth every 6 (six) hours as needed. For pain.     lisinopril-hydrochlorothiazide 10-12.5 MG per tablet  Commonly known as:  PRINZIDE,ZESTORETIC  Take 1 tablet by mouth daily.     metFORMIN 500 MG tablet  Commonly known as:  GLUCOPHAGE  Take 500 mg by mouth at bedtime.     methocarbamol 500 MG tablet  Commonly known as:  ROBAXIN  Take 1 tablet (500 mg total) by mouth 3 (three) times daily.     niacin-simvastatin 500-20 MG 24 hr tablet  Commonly known as:  SIMCOR  Take 1 tablet by mouth at bedtime.     omeprazole 40 MG capsule  Commonly known as:  PRILOSEC  Take 40 mg by mouth 2 (two) times daily.     oxyCODONE-acetaminophen 10-325 MG per tablet  Commonly known as:  PERCOCET  Take 1-2 tablets by mouth every 6 (six) hours as needed for pain.     temazepam 15 MG capsule  Commonly known as:  RESTORIL  Take 15 mg by mouth at bedtime as needed for sleep.     Vitamin D 2000 UNITS Caps  Take 1 capsule by mouth daily.        Diagnostic Studies: Dg Chest 2 View  06/28/2012  *RADIOLOGY REPORT*  Clinical Data: Rotator cuff injury  CHEST - 2 VIEW  Comparison: 01/17/2012  Findings: Heart size is normal.  No pleural effusion or edema. There is no airspace consolidation identified.  Review of the visualized osseous structures is unremarkable.  Mild spondylosis is identified within the thoracic spine.  IMPRESSION:  1.  No acute cardiopulmonary abnormalities. 2.  Mild thoracic spondylosis noted.   Original Report Authenticated By: Signa Kell, M.D.    Dg Chest Port 1 View  07/04/2012  *RADIOLOGY REPORT*  Clinical Data: Respiratory concern has surgery.  PORTABLE CHEST - 1 VIEW  Comparison: 06/28/2012.  Findings: Most notable change since the prior examination is the elevated right hemidiaphragm.  The cause of this is indeterminate.  Subpulmonic effusion not excluded. Limited for  evaluating the right lung base  Right base atelectasis.  Minimal left base atelectasis.  Cardiomegaly with pulmonary vascular congestion.  No gross pneumothorax.  IMPRESSION: Most notable change since the prior examination is the elevated right hemidiaphragm.  The cause of this is indeterminate.  Subpulmonic effusion not excluded.  Limited for evaluating the right lung base  Right base atelectasis.  Minimal left base atelectasis.  Cardiomegaly with pulmonary vascular congestion.  This is a call report.   Original Report Authenticated By: Lacy Duverney, M.D.    Dg Humerus Right  07/04/2012  *RADIOLOGY REPORT*  Clinical Data: Right humerus fracture.  DG C-ARM GT 120 MIN,RIGHT HUMERUS - 2+ VIEW  Technique: Four intraoperative C-arm views submitted for review after surgery.  Comparison:  None.  Findings: Long side plate and screws transfix proximal humerus fracture.  No complication noted on this limited imaging.  IMPRESSION: Long side plate and screws transfix proximal humerus fracture.   Original Report Authenticated By: Lacy Duverney, M.D.    Dg C-arm Gt 120 Min  07/04/2012  *RADIOLOGY REPORT*  Clinical Data: Right humerus fracture.  DG C-ARM GT 120 MIN,RIGHT HUMERUS - 2+ VIEW  Technique: Four intraoperative C-arm views submitted for review after surgery.  Comparison:  None.  Findings: Long side plate and screws transfix proximal humerus fracture.  No complication noted on this limited imaging.  IMPRESSION: Long side plate and screws transfix proximal humerus fracture.   Original Report Authenticated By: Lacy Duverney, M.D.     Disposition: 01-Home or Self Care      Discharge Orders   Future Orders Complete By Expires     Apply dressing  As directed     Scheduling Instructions:      Apply mepilex dressing to right arm please    Call MD / Call 911  As directed     Comments:      If you experience chest pain or shortness of breath, CALL 911 and be transported to the hospital emergency room.  If you  develope a fever above 101 F, pus (white drainage) or increased drainage or redness at the wound, or calf pain, call your surgeon's office.    Constipation Prevention  As directed     Comments:      Drink plenty of fluids.  Prune juice may be helpful.  You may use a stool softener, such as Colace (over the counter) 100 mg twice a day.  Use MiraLax (over the counter) for constipation as needed.    Diet - low sodium heart healthy  As directed     Discharge instructions  As directed     Comments:      1. Pendulums daily - 30 reps 3 times a day 2.Keep incision dry    Face-to-face encounter (required for Medicare/Medicaid patients)  As directed     Comments:      I DEAN,GREGORY SCOTT certify that this patient is under my care and that I, or a nurse practitioner or physician's assistant working with me, had a face-to-face encounter that meets the physician face-to-face encounter requirements with this patient on 07/06/2012. The encounter with the patient was in whole, or in part for the following medical condition(s) which is the primary reason for home health care (List medical condition): humeral shaft nonunion    Questions:  The encounter with the patient was in whole, or in part, for the following medical condition, which is the primary reason for home health care:  yes    I certify that, based on my findings, the following services are medically necessary home health services:  Physical therapy    My clinical findings support the need for the above services:  Pain interferes with ambulation/mobility    Further, I certify that my clinical findings support that this patient is homebound due to:  Pain interferes with ambulation/mobility    Reason for Medically Necessary Home Health Services:  Therapy- Therapeutic Exercises to Increase Strength and Endurance    Home Health  As directed     Questions:      To provide the following care/treatments:  PT    Increase activity slowly as tolerated  As  directed           Signed: DEAN,GREGORY SCOTT 07/10/2012, 5:44 PM

## 2012-08-21 NOTE — Op Note (Signed)
NAMEKERIN, KREN             ACCOUNT NO.:  [id number]  MEDICAL RECORD NO.:  [id number]  LOCATION:  6N25C                        FACILITY:  [facility]  PHYSICIAN:  Burnard Bunting, M.D.    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  PROCEDURE:  Right shoulder nonunion takedown with open reduction and internal fixation of proximal humeral shaft fracture and rotator cuff repair.  SURGEON:  Burnard Bunting, M.D.  ASSISTANT:  Skip Mayer.  Skip Mayer was required during the entire case for opening, closing, retraction, protection of neurovascular structures, limb positioning, hardware placement.  Her assistance was a medical necessity now and because of the complexity of the case, but also because of the large size of the arm.  Her assistance was a medical necessity for the entire case in order to facilitate optimal outcome.  DICTATION ENDS HERE     G. Dorene Grebe, M.D.     GSD/MEDQ  D:  08/21/2012  T:  08/21/2012  Job:  [id number]

## 2013-02-16 ENCOUNTER — Other Ambulatory Visit: Payer: Self-pay | Admitting: Orthopedic Surgery

## 2013-02-19 ENCOUNTER — Encounter (HOSPITAL_COMMUNITY): Payer: Self-pay | Admitting: Pharmacy Technician

## 2013-02-22 ENCOUNTER — Encounter (HOSPITAL_COMMUNITY): Payer: Self-pay

## 2013-02-22 ENCOUNTER — Encounter (HOSPITAL_COMMUNITY)
Admission: RE | Admit: 2013-02-22 | Discharge: 2013-02-22 | Disposition: A | Discharge status: Home / Self Care | Payer: Worker's Compensation | Source: Ambulatory Visit | Attending: Orthopedic Surgery | Admitting: Orthopedic Surgery

## 2013-02-22 DIAGNOSIS — Z01818 Encounter for other preprocedural examination: Secondary | ICD-10-CM | POA: Insufficient documentation

## 2013-02-22 DIAGNOSIS — Z01812 Encounter for preprocedural laboratory examination: Secondary | ICD-10-CM | POA: Insufficient documentation

## 2013-02-22 DIAGNOSIS — Z01810 Encounter for preprocedural cardiovascular examination: Secondary | ICD-10-CM | POA: Insufficient documentation

## 2013-02-22 HISTORY — DX: Personal history of other diseases of the nervous system and sense organs: Z86.69

## 2013-02-22 HISTORY — DX: Major depressive disorder, single episode, unspecified: F32.9

## 2013-02-22 HISTORY — DX: Depression, unspecified: F32.A

## 2013-02-22 LAB — BASIC METABOLIC PANEL
BUN: 11 mg/dL (ref 6–23)
CO2: 25 mEq/L (ref 19–32)
Chloride: 100 mEq/L (ref 96–112)
GFR calc Af Amer: >90 mL/min (ref >90)
GFR calc non Af Amer: >90 mL/min (ref >90)
Potassium: 3.7 mEq/L (ref 3.5–5.1)
Sodium: 138 mEq/L (ref 135–145)

## 2013-02-22 LAB — CBC
HCT: 38.9 % (ref 36.0–46.0)
Hemoglobin: 12.3 g/dL (ref 12.0–15.0)
MCV: 81.4 fL (ref 78.0–100.0)
RBC: 4.78 MIL/uL (ref 3.87–5.11)
RDW: 15.2 % (ref 11.5–15.5)
WBC: 12.3 10*3/uL — ABNORMAL HIGH (ref 4.0–10.5)

## 2013-02-22 MED ORDER — CHLORHEXIDINE GLUCONATE 4 % EX LIQD: 60.0000 mL | Freq: Once | CUTANEOUS | Status: DC

## 2013-02-22 NOTE — Progress Notes (Signed)
02/22/13 1752  OBSTRUCTIVE SLEEP APNEA  Have you ever been diagnosed with sleep apnea through a sleep study? No  Do you snore loudly (loud enough to be heard through closed doors)?  0  Do you often feel tired, fatigued, or sleepy during the daytime? 0  Has anyone observed you stop breathing during your sleep? 0  Do you have, or are you being treated for high blood pressure? 1  BMI more than 35 kg/m2? 1  Age over 57 years old? 1  Neck circumference greater than 40 cm/18 inches? 1 (20 inches)  Gender: 0  Obstructive Sleep Apnea Score 4  Score 4 or greater  Results sent to PCP

## 2013-02-22 NOTE — Pre-Procedure Instructions (Addendum)
Tara [name]  02/22/2013    Your procedure is scheduled on: 02/27/13  Report to [facility]  2 * 3 at 815 AM.  Call this number if you have problems the morning of surgery: [phone]   Remember:   Do not eat food or drink liquids after midnight.   Take these medicines the morning of surgery with A SIP OF WATER: pain needed if needed , omeprazole  STOP aspirin, niacin, omega 3 now              Do not wear jewelry, make-up or nail polish.  Do not wear lotions, powders, or perfumes. You may wear deodorant.  Do not shave 48 hours prior to surgery. Men may shave face and neck.  Do not bring valuables to the hospital.  [facility] is not responsible                  for any belongings or valuables.               Contacts, dentures or bridgework may not be worn into surgery.  Leave suitcase in the car. After surgery it may be brought to your room.  For patients admitted to the hospital, discharge time is determined by your                treatment team.               Patients discharged the day of surgery will not be allowed to drive  home.  Name and phone number of your driver:  Special Instructions: Shower using CHG 2 nights before surgery and the night before surgery.  If you shower the day of surgery use CHG.  Use special wash - you have one bottle of CHG for all showers.  You should use approximately 1/3 of the bottle for each shower.   Please read over the following fact sheets that you were given: Pain Booklet, Coughing and Deep Breathing and Surgical Site Infection Prevention

## 2013-02-22 NOTE — Progress Notes (Signed)
Office called to release order. Spoke with kim

## 2013-02-23 NOTE — Progress Notes (Addendum)
Anesthesia Chart Review:  Patient is a 57 year old female scheduled for removal of heterotopic ossification, right shoulder on 02/27/13 by Dr. August Saucer.  History includes morbid obesity, non-smoker, HTN, HLD, migraines, arthritis, GERD, chronic back pain, gastric ulcer, DM2, diverticulosis, depression, Bell's Palsy, PNA, asthmatic bronchitis. She is s/p ORIF of right wrist fracture '13 and ORIF of humerus fracture with rotator cuff repair '14.  PCP is listed as Dr. Marlou Starks.  EKG on 02/22/13 showed SR with occasional PVC.  Rate is slower when compared to prior EKG on 01/17/12.  She had a low risk stress test with EF 63% in 03/2005, but no recent cardiac testing.  CXR on 06/28/12 showed:  1. No acute cardiopulmonary abnormalities.  2. Mild thoracic spondylosis noted.  Preoperative labs noted.    She tolerated shoulder surgery earlier this year.  If no acute changes then I would anticipate that she could proceed as planned.  Velna Ochs [facility]/Anesthesiology Phone [phone] 02/23/2013 1:15 PM

## 2013-02-26 MED ORDER — CLINDAMYCIN PHOSPHATE 900 MG/50ML IV SOLN
900.0000 mg | INTRAVENOUS | Status: AC
  Administered 2013-02-27: 900 mg via INTRAVENOUS
  Filled 2013-02-26 (×2): qty 50

## 2013-02-27 ENCOUNTER — Encounter (HOSPITAL_COMMUNITY): Payer: Worker's Compensation | Admitting: Vascular Surgery

## 2013-02-27 ENCOUNTER — Encounter (HOSPITAL_COMMUNITY): Payer: Self-pay | Admitting: Anesthesiology

## 2013-02-27 ENCOUNTER — Encounter (HOSPITAL_COMMUNITY)
Admission: RE | Disposition: A | Discharge status: Home / Self Care | Payer: Self-pay | Source: Ambulatory Visit | Attending: Orthopedic Surgery

## 2013-02-27 ENCOUNTER — Observation Stay (HOSPITAL_COMMUNITY)
Admission: RE | Admit: 2013-02-27 | Discharge: 2013-02-28 | Disposition: A | Discharge status: Home / Self Care | Payer: Worker's Compensation | Source: Ambulatory Visit | Attending: Orthopedic Surgery | Admitting: Orthopedic Surgery

## 2013-02-27 ENCOUNTER — Ambulatory Visit (HOSPITAL_COMMUNITY): Payer: Worker's Compensation | Admitting: Anesthesiology

## 2013-02-27 DIAGNOSIS — M898X9 Other specified disorders of bone, unspecified site: Secondary | ICD-10-CM | POA: Insufficient documentation

## 2013-02-27 DIAGNOSIS — M6140 Other calcification of muscle, unspecified site: Principal | ICD-10-CM | POA: Insufficient documentation

## 2013-02-27 DIAGNOSIS — E785 Hyperlipidemia, unspecified: Secondary | ICD-10-CM | POA: Insufficient documentation

## 2013-02-27 DIAGNOSIS — I10 Essential (primary) hypertension: Secondary | ICD-10-CM | POA: Insufficient documentation

## 2013-02-27 DIAGNOSIS — K219 Gastro-esophageal reflux disease without esophagitis: Secondary | ICD-10-CM | POA: Insufficient documentation

## 2013-02-27 DIAGNOSIS — E119 Type 2 diabetes mellitus without complications: Secondary | ICD-10-CM | POA: Insufficient documentation

## 2013-02-27 DIAGNOSIS — D1600 Benign neoplasm of scapula and long bones of unspecified upper limb: Secondary | ICD-10-CM | POA: Insufficient documentation

## 2013-02-27 HISTORY — PX: OSTEOCHONDROMA EXCISION: SHX2137

## 2013-02-27 LAB — GLUCOSE, CAPILLARY
Glucose-Capillary: 114 mg/dL — ABNORMAL HIGH (ref 70–99)
Glucose-Capillary: 118 mg/dL — ABNORMAL HIGH (ref 70–99)

## 2013-02-27 SURGERY — EXCISION, OSTEOCHONDROMA
Anesthesia: General | Site: Arm Upper | Laterality: Right | Wound class: Clean

## 2013-02-27 MED ORDER — LISINOPRIL-HYDROCHLOROTHIAZIDE 10-12.5 MG PO TABS: 1.0000 | ORAL_TABLET | Freq: Every day | ORAL | Status: DC

## 2013-02-27 MED ORDER — HYDROMORPHONE HCL PF 1 MG/ML IJ SOLN
INTRAMUSCULAR | Status: AC
  Filled 2013-02-27: qty 1

## 2013-02-27 MED ORDER — METOCLOPRAMIDE HCL 5 MG/ML IJ SOLN: 5.0000 mg | Freq: Three times a day (TID) | INTRAMUSCULAR | Status: DC | PRN

## 2013-02-27 MED ORDER — SIMVASTATIN 20 MG PO TABS
20.0000 mg | ORAL_TABLET | Freq: Every day | ORAL | Status: DC
  Administered 2013-02-27: 20 mg via ORAL
  Filled 2013-02-27 (×2): qty 1

## 2013-02-27 MED ORDER — NIACIN-SIMVASTATIN ER 500-20 MG PO TB24: 1.0000 | ORAL_TABLET | Freq: Every day | ORAL | Status: DC

## 2013-02-27 MED ORDER — HYDROCHLOROTHIAZIDE 12.5 MG PO CAPS
12.5000 mg | ORAL_CAPSULE | Freq: Every day | ORAL | Status: DC
  Administered 2013-02-28: 12.5 mg via ORAL
  Filled 2013-02-27 (×2): qty 1

## 2013-02-27 MED ORDER — OXYCODONE HCL 5 MG PO TABS
ORAL_TABLET | ORAL | Status: AC
  Filled 2013-02-27: qty 1

## 2013-02-27 MED ORDER — POTASSIUM CHLORIDE IN NACL 20-0.9 MEQ/L-% IV SOLN
INTRAVENOUS | Status: DC
  Administered 2013-02-27: 17:00:00 via INTRAVENOUS
  Filled 2013-02-27 (×3): qty 1000

## 2013-02-27 MED ORDER — ARTIFICIAL TEARS OP OINT
TOPICAL_OINTMENT | OPHTHALMIC | Status: DC | PRN
  Administered 2013-02-27: 1 via OPHTHALMIC

## 2013-02-27 MED ORDER — HYDROMORPHONE HCL PF 1 MG/ML IJ SOLN
0.2500 mg | INTRAMUSCULAR | Status: DC | PRN
  Administered 2013-02-27 (×2): 0.5 mg via INTRAVENOUS

## 2013-02-27 MED ORDER — ASPIRIN 325 MG PO TABS
325.0000 mg | ORAL_TABLET | Freq: Every evening | ORAL | Status: DC
  Administered 2013-02-27: 325 mg via ORAL
  Filled 2013-02-27 (×2): qty 1

## 2013-02-27 MED ORDER — ACETAMINOPHEN 325 MG PO TABS
650.0000 mg | ORAL_TABLET | Freq: Four times a day (QID) | ORAL | Status: DC | PRN
  Filled 2013-02-27: qty 2

## 2013-02-27 MED ORDER — PANTOPRAZOLE SODIUM 40 MG PO TBEC
40.0000 mg | DELAYED_RELEASE_TABLET | Freq: Every day | ORAL | Status: DC
  Administered 2013-02-28: 40 mg via ORAL
  Filled 2013-02-27: qty 1

## 2013-02-27 MED ORDER — PROMETHAZINE HCL 25 MG/ML IJ SOLN: 6.2500 mg | INTRAMUSCULAR | Status: DC | PRN

## 2013-02-27 MED ORDER — LIDOCAINE HCL (CARDIAC) 20 MG/ML IV SOLN
INTRAVENOUS | Status: DC | PRN
  Administered 2013-02-27: 100 mg via INTRAVENOUS

## 2013-02-27 MED ORDER — CHLORHEXIDINE GLUCONATE 4 % EX LIQD: 60.0000 mL | Freq: Once | CUTANEOUS | Status: DC

## 2013-02-27 MED ORDER — ONDANSETRON HCL 4 MG/2ML IJ SOLN: 4.0000 mg | Freq: Four times a day (QID) | INTRAMUSCULAR | Status: DC | PRN

## 2013-02-27 MED ORDER — METOCLOPRAMIDE HCL 10 MG PO TABS: 5.0000 mg | ORAL_TABLET | Freq: Three times a day (TID) | ORAL | Status: DC | PRN

## 2013-02-27 MED ORDER — ACETAMINOPHEN 650 MG RE SUPP: 650.0000 mg | Freq: Four times a day (QID) | RECTAL | Status: DC | PRN

## 2013-02-27 MED ORDER — METFORMIN HCL 500 MG PO TABS
500.0000 mg | ORAL_TABLET | Freq: Every day | ORAL | Status: DC
  Administered 2013-02-27: 500 mg via ORAL
  Filled 2013-02-27 (×2): qty 1

## 2013-02-27 MED ORDER — CALCIUM CARBONATE 600 MG PO TABS
600.0000 mg | ORAL_TABLET | Freq: Two times a day (BID) | ORAL | Status: DC
  Filled 2013-02-27 (×2): qty 1

## 2013-02-27 MED ORDER — METHOCARBAMOL 500 MG PO TABS: 500.0000 mg | ORAL_TABLET | Freq: Four times a day (QID) | ORAL | Status: DC | PRN

## 2013-02-27 MED ORDER — ONDANSETRON HCL 4 MG PO TABS: 4.0000 mg | ORAL_TABLET | Freq: Four times a day (QID) | ORAL | Status: DC | PRN

## 2013-02-27 MED ORDER — CALCIUM CARBONATE 1250 (500 CA) MG PO TABS
1.0000 | ORAL_TABLET | Freq: Two times a day (BID) | ORAL | Status: DC
  Administered 2013-02-27 – 2013-02-28 (×2): 500 mg via ORAL
  Filled 2013-02-27 (×4): qty 1

## 2013-02-27 MED ORDER — ONDANSETRON HCL 4 MG/2ML IJ SOLN
INTRAMUSCULAR | Status: DC | PRN
  Administered 2013-02-27: 4 mg via INTRAVENOUS

## 2013-02-27 MED ORDER — MENTHOL 3 MG MT LOZG: 1.0000 | LOZENGE | OROMUCOSAL | Status: DC | PRN

## 2013-02-27 MED ORDER — PROPOFOL 10 MG/ML IV BOLUS
INTRAVENOUS | Status: DC | PRN
  Administered 2013-02-27: 200 mg via INTRAVENOUS
  Administered 2013-02-27: 50 mg via INTRAVENOUS

## 2013-02-27 MED ORDER — SUCCINYLCHOLINE CHLORIDE 20 MG/ML IJ SOLN
INTRAMUSCULAR | Status: DC | PRN
  Administered 2013-02-27: 100 mg via INTRAVENOUS

## 2013-02-27 MED ORDER — HYDROCODONE-ACETAMINOPHEN 10-325 MG PO TABS
1.0000 | ORAL_TABLET | Freq: Two times a day (BID) | ORAL | Status: DC | PRN
  Administered 2013-02-27: 1 via ORAL
  Filled 2013-02-27 (×2): qty 1

## 2013-02-27 MED ORDER — LACTATED RINGERS IV SOLN
INTRAVENOUS | Status: DC | PRN
  Administered 2013-02-27 (×2): via INTRAVENOUS

## 2013-02-27 MED ORDER — CLINDAMYCIN PHOSPHATE 600 MG/50ML IV SOLN
600.0000 mg | Freq: Four times a day (QID) | INTRAVENOUS | Status: AC
  Administered 2013-02-27 (×2): 600 mg via INTRAVENOUS
  Filled 2013-02-27 (×2): qty 50

## 2013-02-27 MED ORDER — NIACIN ER 500 MG PO CPCR
500.0000 mg | ORAL_CAPSULE | Freq: Every day | ORAL | Status: DC
  Administered 2013-02-27: 500 mg via ORAL
  Filled 2013-02-27 (×2): qty 1

## 2013-02-27 MED ORDER — HYDROMORPHONE HCL PF 1 MG/ML IJ SOLN: 0.5000 mg | INTRAMUSCULAR | Status: DC | PRN

## 2013-02-27 MED ORDER — OXYCODONE HCL 5 MG PO TABS
5.0000 mg | ORAL_TABLET | Freq: Once | ORAL | Status: AC | PRN
  Administered 2013-02-27: 5 mg via ORAL

## 2013-02-27 MED ORDER — ACETAMINOPHEN 325 MG PO TABS
650.0000 mg | ORAL_TABLET | Freq: Four times a day (QID) | ORAL | Status: DC | PRN
  Administered 2013-02-28: 650 mg via ORAL

## 2013-02-27 MED ORDER — METHOCARBAMOL 100 MG/ML IJ SOLN
500.0000 mg | Freq: Four times a day (QID) | INTRAVENOUS | Status: DC | PRN
  Filled 2013-02-27: qty 5

## 2013-02-27 MED ORDER — PHENYLEPHRINE HCL 10 MG/ML IJ SOLN
INTRAMUSCULAR | Status: DC | PRN
  Administered 2013-02-27: 120 ug via INTRAVENOUS
  Administered 2013-02-27: 80 ug via INTRAVENOUS
  Administered 2013-02-27: 120 ug via INTRAVENOUS

## 2013-02-27 MED ORDER — LISINOPRIL 10 MG PO TABS
10.0000 mg | ORAL_TABLET | Freq: Every day | ORAL | Status: DC
  Administered 2013-02-28: 10 mg via ORAL
  Filled 2013-02-27 (×2): qty 1

## 2013-02-27 MED ORDER — ALBUMIN HUMAN 5 % IV SOLN
INTRAVENOUS | Status: DC | PRN
  Administered 2013-02-27: 12:00:00 via INTRAVENOUS

## 2013-02-27 MED ORDER — FENTANYL CITRATE 0.05 MG/ML IJ SOLN
INTRAMUSCULAR | Status: DC | PRN
  Administered 2013-02-27: 50 ug via INTRAVENOUS
  Administered 2013-02-27: 100 ug via INTRAVENOUS
  Administered 2013-02-27: 50 ug via INTRAVENOUS

## 2013-02-27 MED ORDER — METHOCARBAMOL 100 MG/ML IJ SOLN: 500.0000 mg | Freq: Four times a day (QID) | INTRAVENOUS | Status: DC | PRN

## 2013-02-27 MED ORDER — MIDAZOLAM HCL 5 MG/5ML IJ SOLN
INTRAMUSCULAR | Status: DC | PRN
  Administered 2013-02-27 (×2): 1 mg via INTRAVENOUS

## 2013-02-27 MED ORDER — PHENYLEPHRINE HCL 10 MG/ML IJ SOLN
10.0000 mg | INTRAVENOUS | Status: DC | PRN
  Administered 2013-02-27: 40 ug/min via INTRAVENOUS

## 2013-02-27 MED ORDER — PHENOL 1.4 % MT LIQD: 1.0000 | OROMUCOSAL | Status: DC | PRN

## 2013-02-27 MED ORDER — METHOCARBAMOL 500 MG PO TABS
500.0000 mg | ORAL_TABLET | Freq: Four times a day (QID) | ORAL | Status: DC | PRN
  Administered 2013-02-28: 500 mg via ORAL
  Filled 2013-02-27: qty 1

## 2013-02-27 MED ORDER — LACTATED RINGERS IV SOLN
INTRAVENOUS | Status: DC
  Administered 2013-02-27: 09:00:00 via INTRAVENOUS

## 2013-02-27 MED ORDER — CLINDAMYCIN PHOSPHATE 600 MG/50ML IV SOLN: 600.0000 mg | Freq: Four times a day (QID) | INTRAVENOUS | Status: DC

## 2013-02-27 MED ORDER — OXYCODONE HCL 5 MG/5ML PO SOLN: 5.0000 mg | Freq: Once | ORAL | Status: AC | PRN

## 2013-02-27 SURGICAL SUPPLY — 53 items
BANDAGE CONFORM 3  STR LF (GAUZE/BANDAGES/DRESSINGS) IMPLANT
BANDAGE ELASTIC 4 VELCRO ST LF (GAUZE/BANDAGES/DRESSINGS) ×2 IMPLANT
BENZOIN TINCTURE PRP APPL 2/3 (GAUZE/BANDAGES/DRESSINGS) ×2 IMPLANT
BLADE SURG ROTATE 9660 (MISCELLANEOUS) IMPLANT
BNDG ELASTIC 2 VLCR STRL LF (GAUZE/BANDAGES/DRESSINGS) IMPLANT
BNDG ESMARK 4X9 LF (GAUZE/BANDAGES/DRESSINGS) IMPLANT
CLOTH BEACON ORANGE TIMEOUT ST (SAFETY) ×2 IMPLANT
CLSR STERI-STRIP ANTIMIC 1/2X4 (GAUZE/BANDAGES/DRESSINGS) ×2 IMPLANT
CORDS BIPOLAR (ELECTRODE) ×2 IMPLANT
COVER SURGICAL LIGHT HANDLE (MISCELLANEOUS) ×2 IMPLANT
CUFF TOURNIQUET SINGLE 18IN (TOURNIQUET CUFF) IMPLANT
CUFF TOURNIQUET SINGLE 24IN (TOURNIQUET CUFF) IMPLANT
DRAPE INCISE IOBAN 66X45 STRL (DRAPES) ×6 IMPLANT
DRAPE OEC MINIVIEW 54X84 (DRAPES) IMPLANT
DRAPE U-SHAPE 47X51 STRL (DRAPES) ×4 IMPLANT
DRSG EMULSION OIL 3X3 NADH (GAUZE/BANDAGES/DRESSINGS) IMPLANT
DRSG MEPILEX BORDER 4X8 (GAUZE/BANDAGES/DRESSINGS) ×2 IMPLANT
DURAPREP 26ML APPLICATOR (WOUND CARE) ×2 IMPLANT
ELECT REM PT RETURN 9FT ADLT (ELECTROSURGICAL) ×2
ELECTRODE REM PT RTRN 9FT ADLT (ELECTROSURGICAL) ×1 IMPLANT
GAUZE SPONGE 2X2 8PLY STRL LF (GAUZE/BANDAGES/DRESSINGS) IMPLANT
GAUZE XEROFORM 1X8 LF (GAUZE/BANDAGES/DRESSINGS) IMPLANT
GAUZE XEROFORM 5X9 LF (GAUZE/BANDAGES/DRESSINGS) ×2 IMPLANT
GLOVE BIOGEL PI IND STRL 8 (GLOVE) ×1 IMPLANT
GLOVE BIOGEL PI INDICATOR 8 (GLOVE) ×1
GLOVE SURG ORTHO 8.0 STRL STRW (GLOVE) ×2 IMPLANT
GOWN PREVENTION PLUS LG XLONG (DISPOSABLE) ×2 IMPLANT
GOWN STRL NON-REIN LRG LVL3 (GOWN DISPOSABLE) ×4 IMPLANT
KIT BASIN OR (CUSTOM PROCEDURE TRAY) ×2 IMPLANT
KIT ROOM TURNOVER OR (KITS) ×2 IMPLANT
MANIFOLD NEPTUNE II (INSTRUMENTS) ×2 IMPLANT
NS IRRIG 1000ML POUR BTL (IV SOLUTION) ×2 IMPLANT
PACK SHOULDER (CUSTOM PROCEDURE TRAY) ×2 IMPLANT
PAD ARMBOARD 7.5X6 YLW CONV (MISCELLANEOUS) ×4 IMPLANT
PENCIL BUTTON HOLSTER BLD 10FT (ELECTRODE) IMPLANT
SPECIMEN JAR SMALL (MISCELLANEOUS) ×2 IMPLANT
SPONGE GAUZE 2X2 STER 10/PKG (GAUZE/BANDAGES/DRESSINGS)
SPONGE GAUZE 4X4 12PLY (GAUZE/BANDAGES/DRESSINGS) IMPLANT
STRIP CLOSURE SKIN 1/2X4 (GAUZE/BANDAGES/DRESSINGS) ×2 IMPLANT
SUCTION FRAZIER TIP 10 FR DISP (SUCTIONS) ×2 IMPLANT
SUT ETHIBOND 4 0 TF (SUTURE) IMPLANT
SUT ETHIBOND 5 0 P 3 (SUTURE)
SUT ETHILON 4 0 P 3 18 (SUTURE) IMPLANT
SUT ETHILON 5 0 P 3 18 (SUTURE)
SUT NYLON ETHILON 5-0 P-3 1X18 (SUTURE) IMPLANT
SUT POLY ETHIBOND 5-0 P-3 1X18 (SUTURE) IMPLANT
SUT PROLENE 4 0 P 3 18 (SUTURE) IMPLANT
SUT SILK 4 0 PS 2 (SUTURE) IMPLANT
SUT VIC AB 3-0 FS2 27 (SUTURE) ×2 IMPLANT
TOWEL OR 17X24 6PK STRL BLUE (TOWEL DISPOSABLE) ×2 IMPLANT
TOWEL OR 17X26 10 PK STRL BLUE (TOWEL DISPOSABLE) ×2 IMPLANT
TUBE CONNECTING 12X1/4 (SUCTIONS) ×2 IMPLANT
WATER STERILE IRR 1000ML POUR (IV SOLUTION) ×2 IMPLANT
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
## 2013-02-27 NOTE — Anesthesia Procedure Notes (Signed)
Procedure Name: Intubation Date/Time: 02/27/2013 11:24 AM Performed by: Gayla Medicus Pre-anesthesia Checklist: Patient identified, Timeout performed, Emergency Drugs available, Suction available and Patient being monitored Patient Re-evaluated:Patient Re-evaluated prior to inductionOxygen Delivery Method: Circle system utilized Preoxygenation: Pre-oxygenation with 100% oxygen Intubation Type: IV induction Laryngoscope Size: Mac and 3 Grade View: Grade II Tube type: Oral Tube size: 7.0 mm Number of attempts: 1 Airway Equipment and Method: Stylet Placement Confirmation: ETT inserted through vocal cords under direct vision,  positive ETCO2 and breath sounds checked- equal and bilateral Secured at: 22 cm Tube secured with: Tape Dental Injury: Teeth and Oropharynx as per pre-operative assessment

## 2013-02-27 NOTE — Preoperative (Signed)
Beta Blockers   Reason not to administer Beta Blockers:Not Applicable 

## 2013-02-27 NOTE — Anesthesia Postprocedure Evaluation (Signed)
Anesthesia Post Note  Patient: Tara [name]  Procedure(s) Performed: Procedure(s) (LRB): OSTEOCHONDROMA EXCISION (Right)  Anesthesia type: general  Patient location: PACU  Post pain: Pain level controlled  Post assessment: Patient's Cardiovascular Status Stable  Last Vitals:  Filed Vitals:   02/27/13 1306  BP: 116/58  Pulse: 90  Temp:   Resp: 12    Post vital signs: Reviewed and stable  Level of consciousness: sedated  Complications: No apparent anesthesia complications

## 2013-02-27 NOTE — Transfer of Care (Signed)
Immediate Anesthesia Transfer of Care Note  Patient: Tara [name]  Procedure(s) Performed: Procedure(s) with comments: OSTEOCHONDROMA EXCISION (Right) - REMOVAL HETEROTOPIC OSSIFICATION  Patient Location: PACU  Anesthesia Type:General  Level of Consciousness: awake, alert  and oriented  Airway & Oxygen Therapy: Patient Spontanous Breathing and Patient connected to face mask oxygen  Post-op Assessment: Report given to PACU RN and Post -op Vital signs reviewed and stable  Post vital signs: Reviewed and stable  Complications: No apparent anesthesia complications

## 2013-02-27 NOTE — Brief Op Note (Signed)
02/27/2013  12:49 PM  PATIENT:  Tara [name]  57 y.o. female  PRE-OPERATIVE DIAGNOSIS:  RIGHT ARM HETEROTOPIC OSSIFICATION  POST-OPERATIVE DIAGNOSIS:  RIGHT ARM HETEROTOPIC OSSIFICATION  PROCEDURE:  Procedure(s): OSTEOCHONDROMA EXCISION  SURGEON:  Surgeon(s): Cammy Copa, MD  ASSISTANT: none  ANESTHESIA:   general  EBL: 25 ml    Total I/O In: 1350 [I.V.:1100; IV Piggyback:250] Out: 50 [Blood:50]  BLOOD ADMINISTERED: none  DRAINS: none   LOCAL MEDICATIONS USED:  none  SPECIMEN:  Ho to path  COUNTS:  YES  TOURNIQUET:  * No tourniquets in log *  DICTATION: .Other Dictation: Dictation Number [id number]  PLAN OF CARE: Admit for overnight observation  PATIENT DISPOSITION:  PACU - hemodynamically stable

## 2013-02-27 NOTE — Anesthesia Preprocedure Evaluation (Signed)
Anesthesia Evaluation    History of Anesthesia Complications (+) PONV and history of anesthetic complications  Airway       Dental   Pulmonary asthma , pneumonia -, resolved,          Cardiovascular hypertension, Pt. on medications     Neuro/Psych  Headaches, PSYCHIATRIC DISORDERS Depression  Neuromuscular disease    GI/Hepatic PUD, GERD-  Medicated,  Endo/Other  diabetes, Oral Hypoglycemic Agents  Renal/GU      Musculoskeletal   Abdominal   Peds  Hematology   Anesthesia Other Findings   Reproductive/Obstetrics                           Anesthesia Physical Anesthesia Plan  ASA: III  Anesthesia Plan: General   Post-op Pain Management:    Induction: Intravenous  Airway Management Planned: LMA  Additional Equipment:   Intra-op Plan:   Post-operative Plan: Extubation in OR  Informed Consent:   Plan Discussed with: CRNA, Anesthesiologist and Surgeon  Anesthesia Plan Comments:         Anesthesia Quick Evaluation

## 2013-02-28 ENCOUNTER — Encounter (HOSPITAL_COMMUNITY): Payer: Self-pay | Admitting: General Practice

## 2013-02-28 LAB — GLUCOSE, CAPILLARY: Glucose-Capillary: 120 mg/dL — ABNORMAL HIGH (ref 70–99)

## 2013-02-28 MED ORDER — METHOCARBAMOL 500 MG PO TABS: 500.0000 mg | ORAL_TABLET | Freq: Four times a day (QID) | ORAL | Status: DC | PRN

## 2013-02-28 MED ORDER — HYDROCODONE-ACETAMINOPHEN 10-325 MG PO TABS: 1.0000 | ORAL_TABLET | Freq: Two times a day (BID) | ORAL | Status: DC | PRN

## 2013-02-28 NOTE — Progress Notes (Signed)
Subjective: Pt stable - pain controlled   Objective: Vital signs in last 24 hours: Temp:  [97.8 F (36.6 C)-98.6 F (37 C)] 98.2 F (36.8 C) (10/29 0520) Pulse Rate:  [87-94] 87 (10/29 0520) Resp:  [11-23] 18 (10/29 0520) BP: (110-131)/(48-65) 131/48 mmHg (10/29 0520) SpO2:  [94 %-100 %] 99 % (10/29 0520)  Intake/Output from previous day: 10/28 0701 - 10/29 0700 In: 3080 [P.O.:480; I.V.:2300; IV Piggyback:300] Out: 50 [Blood:50] Intake/Output this shift:    Exam:  Neurologically intact Sensation intact distally Intact pulses distally  Labs: No results found for this basename: HGB,  in the last 72 hours No results found for this basename: WBC, RBC, HCT, PLT,  in the last 72 hours No results found for this basename: NA, K, CL, CO2, BUN, CREATININE, GLUCOSE, CALCIUM,  in the last 72 hours No results found for this basename: LABPT, INR,  in the last 72 hours  Assessment/Plan: Dc today   Ivan Maskell SCOTT 02/28/2013, 8:22 AM

## 2013-02-28 NOTE — Care Management Utilization Note (Signed)
Utilization review completed. Adien Kimmel, RN BSN 

## 2013-02-28 NOTE — Discharge Summary (Signed)
Physician Discharge Summary  Patient ID: Tara [name] MRN: [id number] DOB/AGE: [birth year] 58 y.o.  Admit date: 02/27/2013 Discharge date: 02/28/2013  Admission Diagnoses:  Right humeral shaft exostosis  Discharge Diagnoses:  Same  Surgeries: Procedure(s): OSTEOCHONDROMA EXCISION on 02/27/2013   Consultants:    Discharged Condition: Stable  Hospital Course: RAESHAWN TAFOLLA is an 57 y.o. female who was admitted 02/27/2013 with a chief complaint of right arm pain, and found to have a diagnosis of humeral shft exostosis.  They were brought to the operating room on 02/27/2013 and underwent the above named procedures.  She did well and dced home pod 1.  Antibiotics given:  Anti-infectives   Start     Dose/Rate Route Frequency Ordered Stop   02/27/13 1600  clindamycin (CLEOCIN) IVPB 600 mg     600 mg 100 mL/hr over 30 Minutes Intravenous Every 6 hours 02/27/13 1441 02/27/13 2238   02/27/13 1445  clindamycin (CLEOCIN) IVPB 600 mg  Status:  Discontinued     600 mg 100 mL/hr over 30 Minutes Intravenous Every 6 hours 02/27/13 1441 02/27/13 1505   02/27/13 0600  clindamycin (CLEOCIN) IVPB 900 mg     900 mg 100 mL/hr over 30 Minutes Intravenous On call to O.R. 02/26/13 1424 02/27/13 1130    .  Recent vital signs:  Filed Vitals:   02/28/13 0520  BP: 131/48  Pulse: 87  Temp: 98.2 F (36.8 C)  Resp: 18    Recent laboratory studies:  Results for orders placed during the hospital encounter of 02/27/13  GLUCOSE, CAPILLARY      Result Value Range   Glucose-Capillary 114 (*) 70 - 99 mg/dL  GLUCOSE, CAPILLARY      Result Value Range   Glucose-Capillary 106 (*) 70 - 99 mg/dL  GLUCOSE, CAPILLARY      Result Value Range   Glucose-Capillary 118 (*) 70 - 99 mg/dL  GLUCOSE, CAPILLARY      Result Value Range   Glucose-Capillary 120 (*) 70 - 99 mg/dL    Discharge Medications:     Medication List    STOP taking these medications       acetaminophen 650 MG CR tablet   Commonly known as:  TYLENOL     diphenhydramine-acetaminophen 25-500 MG Tabs  Commonly known as:  TYLENOL PM      TAKE these medications       ANTARA 130 MG capsule  Generic drug:  fenofibrate micronized  Take 130 mg by mouth every evening.     aspirin 325 MG tablet  Take 325 mg by mouth every evening. 30 minutes prior to cholesterol medication.     calcium carbonate 600 MG Tabs tablet  Commonly known as:  OS-CAL  Take 600 mg by mouth 2 (two) times daily with a meal.     CALCIUM-VITAMIN D PO  Take 1 tablet by mouth 2 (two) times daily. Calcium 600 mg Vitamin D 800 units     Fish Oil 1200 MG Caps  Take 1,400 mg by mouth every morning.     HYDROcodone-acetaminophen 10-325 MG per tablet  Commonly known as:  NORCO  Take 1 tablet by mouth every 12 (twelve) hours as needed.     lisinopril-hydrochlorothiazide 10-12.5 MG per tablet  Commonly known as:  PRINZIDE,ZESTORETIC  Take 1 tablet by mouth daily.     metFORMIN 500 MG tablet  Commonly known as:  GLUCOPHAGE  Take 500 mg by mouth at bedtime.     methocarbamol 500 MG tablet  Commonly known as:  ROBAXIN  Take 1 tablet (500 mg total) by mouth every 6 (six) hours as needed.     niacin-simvastatin 500-20 MG 24 hr tablet  Commonly known as:  SIMCOR  Take 1 tablet by mouth at bedtime.     omeprazole 40 MG capsule  Commonly known as:  PRILOSEC  Take 40 mg by mouth 2 (two) times daily.     Vitamin D 2000 UNITS Caps  Take 1 capsule by mouth daily.        Diagnostic Studies: No results found.  Disposition: 01-Home or Self Care      Discharge Orders   Future Orders Complete By Expires   Call MD / Call 911  As directed    Comments:     If you experience chest pain or shortness of breath, CALL 911 and be transported to the hospital emergency room.  If you develope a fever above 101 F, pus (white drainage) or increased drainage or redness at the wound, or calf pain, call your surgeon's office.   Constipation Prevention   As directed    Comments:     Drink plenty of fluids.  Prune juice may be helpful.  You may use a stool softener, such as Colace (over the counter) 100 mg twice a day.  Use MiraLax (over the counter) for constipation as needed.   Diet - low sodium heart healthy  As directed    Discharge instructions  As directed    Comments:     Range of motion as tolerated Keep incision dry   Increase activity slowly as tolerated  As directed          Signed: Dolores Ewing SCOTT 02/28/2013, 8:27 AM

## 2013-02-28 NOTE — Progress Notes (Signed)
OT Cancellation Note  Patient Details Name: RENIA MIKELSON MRN: [id number] DOB: 1955/06/10   Cancelled Treatment:    Reason Eval/Treat Not Completed: OT screened, no needs identified, will sign off Pt has experience with Pendulums and selfcare tasks based on previous shoulder/arm surgeries prior to this.  Pt will continue AROM for all joints in the right arm.  No further needs at this time.   Haylyn Halberg 02/28/2013, 9:31 AM

## 2013-02-28 NOTE — Progress Notes (Signed)
Patient discharged to home accompanied by family. Discharge instructions and rx given and explained and patient stated understanding. Patients IV was removed and she left unit in a stable condition via wheelchair. 

## 2013-02-28 NOTE — Op Note (Signed)
[name], [name]             ACCOUNT NO.:  [id number]  MEDICAL RECORD NO.:  [id number]  LOCATION:  5N13C                        FACILITY:  [facility]  PHYSICIAN:  Burnard Bunting, M.D.    DATE OF BIRTH:  06-29-1955  DATE OF PROCEDURE:  02/27/2013 DATE OF DISCHARGE:                              OPERATIVE REPORT   PREOPERATIVE DIAGNOSIS:  Right arm heterotopic ossification.  POSTOPERATIVE DIAGNOSIS:  Right arm heterotopic ossification.  PROCEDURE:  Right arm open removal of heterotopic ossification around previous fracture and bone grafting site.  SURGEON:  Burnard Bunting, M.D.  ASSISTANT:  None.  ANESTHESIA:  General.  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  She developed heterotopic ossification, which is prominent beneath the skin and becoming symptomatic, who presents now for excision of the heterotopic ossification.  Fracture does appear healed clinically and on radiographic exam.  PROCEDURE IN DETAIL:  The patient was brought to the operating room where general endotracheal anesthesia was induced.  Preoperative antibiotics were administered.  Time-out was called.  Right arm was prescrubbed with alcohol and Betadine, which was allowed to air dry, prepped with DuraPrep solution, draped in a sterile manner.  [name] was used to cover the operative field.  Incision was made at mid portion of the incision.  Heterotopic bone was palpable upon dividing the fascia. Using careful dissection and dissecting in the subperiosteal manner, an anvil shaped thin wafer of bone attached to the humeral shaft was dissected.  It was truncated with an osteotome.  This specimen was sent to pathology.  This measured approximately 3.5 x 5 cm.  Thorough irrigation was then performed.  Incision was then closed using interrupted inverted 0 Vicryl suture, 2-0 Vicryl suture in a running 3-0 pullout Prolene.  Bulky  dressing was applied.  The patient tolerated the procedure well without immediate complications.     Burnard Bunting, M.D.     GSD/MEDQ  D:  02/27/2013  T:  02/28/2013  Job:  [id number]

## 2013-03-08 ENCOUNTER — Other Ambulatory Visit: Payer: Self-pay

## 2015-09-04 ENCOUNTER — Other Ambulatory Visit (HOSPITAL_COMMUNITY): Payer: Self-pay | Admitting: Orthopedic Surgery

## 2015-09-04 DIAGNOSIS — M25511 Pain in right shoulder: Secondary | ICD-10-CM

## 2015-09-12 ENCOUNTER — Ambulatory Visit (HOSPITAL_COMMUNITY)
Admission: RE | Admit: 2015-09-12 | Discharge: 2015-09-12 | Disposition: A | Discharge status: Home / Self Care | Payer: BLUE CROSS/BLUE SHIELD | Source: Ambulatory Visit | Attending: Orthopedic Surgery | Admitting: Orthopedic Surgery

## 2015-09-12 DIAGNOSIS — M75101 Unspecified rotator cuff tear or rupture of right shoulder, not specified as traumatic: Secondary | ICD-10-CM | POA: Insufficient documentation

## 2015-09-12 DIAGNOSIS — M25511 Pain in right shoulder: Secondary | ICD-10-CM

## 2015-09-12 MED ORDER — SODIUM CHLORIDE 0.9 % IJ SOLN
INTRAMUSCULAR | Status: AC
  Administered 2015-09-12: 10 mL via INTRAMUSCULAR
  Filled 2015-09-12: qty 10

## 2015-09-12 MED ORDER — IOHEXOL 180 MG/ML  SOLN
20.0000 mL | Freq: Once | INTRAMUSCULAR | Status: AC | PRN
  Administered 2015-09-12: 7 mL via INTRA_ARTICULAR

## 2015-09-12 MED ORDER — LIDOCAINE HCL (PF) 1 % IJ SOLN
INTRAMUSCULAR | Status: AC
  Administered 2015-09-12: 5 mL
  Filled 2015-09-12: qty 5

## 2016-03-22 ENCOUNTER — Other Ambulatory Visit: Payer: Self-pay | Admitting: Rheumatology

## 2016-03-22 DIAGNOSIS — R5382 Chronic fatigue, unspecified: Secondary | ICD-10-CM

## 2016-03-22 DIAGNOSIS — R768 Other specified abnormal immunological findings in serum: Secondary | ICD-10-CM

## 2016-03-22 DIAGNOSIS — Z1321 Encounter for screening for nutritional disorder: Secondary | ICD-10-CM

## 2016-03-22 DIAGNOSIS — M2550 Pain in unspecified joint: Secondary | ICD-10-CM

## 2016-03-22 LAB — CBC WITH DIFFERENTIAL/PLATELET
BASOS ABS: 0 {cells}/uL (ref 0–200)
BASOS PCT: 0 %
EOS ABS: 118 {cells}/uL (ref 15–500)
Eosinophils Relative: 1 %
HEMATOCRIT: 38.4 % (ref 35.0–45.0)
HEMOGLOBIN: 12.6 g/dL (ref 11.7–15.5)
LYMPHS ABS: 3422 {cells}/uL (ref 850–3900)
Lymphocytes Relative: 29 %
MCH: 27.3 pg (ref 27.0–33.0)
MCHC: 32.8 g/dL (ref 32.0–36.0)
MCV: 83.3 fL (ref 80.0–100.0)
MONO ABS: 944 {cells}/uL (ref 200–950)
MPV: 10.6 fL (ref 7.5–12.5)
Monocytes Relative: 8 %
NEUTROS ABS: 7316 {cells}/uL (ref 1500–7800)
Neutrophils Relative %: 62 %
PLATELETS: 320 10*3/uL (ref 140–400)
RBC: 4.61 MIL/uL (ref 3.80–5.10)
RDW: 15 % (ref 11.0–15.0)
WBC: 11.8 10*3/uL — ABNORMAL HIGH (ref 3.8–10.8)

## 2016-03-22 LAB — COMPLETE METABOLIC PANEL WITH GFR
ALBUMIN: 3.9 g/dL (ref 3.6–5.1)
ALK PHOS: 78 U/L (ref 33–130)
ALT: 21 U/L (ref 6–29)
AST: 19 U/L (ref 10–35)
BILIRUBIN TOTAL: 0.2 mg/dL (ref 0.2–1.2)
BUN: 10 mg/dL (ref 7–25)
CO2: 28 mmol/L (ref 20–31)
CREATININE: 0.52 mg/dL (ref 0.50–0.99)
Calcium: 9 mg/dL (ref 8.6–10.4)
Chloride: 103 mmol/L (ref 98–110)
GFR, Est Non African American: >89 mL/min (ref >=60)
Glucose, Bld: 134 mg/dL — ABNORMAL HIGH (ref 65–99)
Potassium: 3.9 mmol/L (ref 3.5–5.3)
Sodium: 140 mmol/L (ref 135–146)
TOTAL PROTEIN: 6.4 g/dL (ref 6.1–8.1)

## 2016-03-22 LAB — CK: CK TOTAL: 37 U/L (ref 7–177)

## 2016-03-22 LAB — TSH: TSH: 1.63 m[IU]/L

## 2016-03-22 NOTE — Progress Notes (Signed)
Beta 2 glycoprotein, anticardiolipin, lupus anticoagulant, vitamin D, SPEP, CK, TSH, immunoglobulins, rheumatoid factor, CCP, ANA with titer, C3, C4 and CBC with diff.

## 2016-03-23 LAB — VITAMIN D 25 HYDROXY (VIT D DEFICIENCY, FRACTURES): Vit D, 25-Hydroxy: 34 ng/mL (ref 30–100)

## 2016-03-23 LAB — IGG, IGA, IGM
IGA: 223 mg/dL (ref 81–463)
IGM, SERUM: 52 mg/dL (ref 48–271)
IgG (Immunoglobin G), Serum: 847 mg/dL (ref 694–1618)

## 2016-03-23 LAB — CARDIOLIPIN ANTIBODIES, IGG, IGM, IGA: Anticardiolipin IgA: <11 [APL'U]

## 2016-03-23 LAB — ANA: Anti Nuclear Antibody(ANA): NEGATIVE

## 2016-03-23 LAB — RHEUMATOID FACTOR

## 2016-03-23 LAB — C3 AND C4
C3 Complement: 224 mg/dL — ABNORMAL HIGH (ref 90–180)
C4 Complement: 34 mg/dL (ref 16–47)

## 2016-03-23 LAB — CYCLIC CITRUL PEPTIDE ANTIBODY, IGG: Cyclic Citrullin Peptide Ab: <16 Units

## 2016-03-24 LAB — RFX DRVVT SCR W/RFLX CONF 1:1 MIX: DRVVT SCREEN: 41 s (ref <=45)

## 2016-03-24 LAB — RFX PTT-LA W/RFX TO HEX PHASE CONF: PTT-LA Screen: 33 s (ref <=40)

## 2016-03-24 LAB — LUPUS ANTICOAGULANT PANEL

## 2016-03-24 LAB — PROTEIN ELECTROPHORESIS, SERUM, WITH REFLEX
ALBUMIN ELP: 3.5 g/dL — AB (ref 3.8–4.8)
ALPHA-1-GLOBULIN: 0.3 g/dL (ref 0.2–0.3)
ALPHA-2-GLOBULIN: 0.9 g/dL (ref 0.5–0.9)
Beta 2: 0.5 g/dL (ref 0.2–0.5)
Beta Globulin: 0.5 g/dL (ref 0.4–0.6)
Gamma Globulin: 0.8 g/dL (ref 0.8–1.7)
TOTAL PROTEIN, SERUM ELECTROPHOR: 6.4 g/dL (ref 6.1–8.1)

## 2016-03-24 LAB — BETA-2 GLYCOPROTEIN ANTIBODIES
BETA 2 GLYCO I IGG: 9 SGU (ref <=20)
Beta-2-Glycoprotein I IgA: <9 SAU (ref <=20)

## 2016-03-29 ENCOUNTER — Telehealth: Payer: Self-pay | Admitting: Radiology

## 2016-03-29 NOTE — Telephone Encounter (Signed)
I have called patient to advise labs are normal  

## 2016-03-29 NOTE — Telephone Encounter (Signed)
-----   Message from Eliezer Lofts, [place] sent at 03/29/2016 12:14 PM EST ----- All labs are WNL.  No change in treatment. Keep f-u appt.   See below for specifics on each lab.  #1: Lupus anticoagulant is negative #2: Beta-2 glycoprotein is negative #3: SPEP shows band 1 and 2 and 3 are negative #4: Anticardiolipin is negative #5: C4 is 34 and normal  &  and C3 is elevated at 224 which is acceptable #6: IgG, IgA, IgM are all negative #7: ANA is negative #8: CCP is negative #9: Rheumatoid factor is negative #10: Vitamin D is normal at 34 #11: TSH is negative #12: CK is negative #13: CBC with differential is wnl except mild elevation of wbc at 11.8 (we can monitor) and CMP with GFR within normal limits

## 2016-04-14 DIAGNOSIS — R5383 Other fatigue: Secondary | ICD-10-CM | POA: Insufficient documentation

## 2016-04-14 DIAGNOSIS — F5101 Primary insomnia: Secondary | ICD-10-CM | POA: Insufficient documentation

## 2016-04-14 DIAGNOSIS — K1379 Other lesions of oral mucosa: Secondary | ICD-10-CM | POA: Insufficient documentation
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
## 2016-04-19 ENCOUNTER — Encounter: Payer: Self-pay | Admitting: Rheumatology

## 2016-04-19 ENCOUNTER — Ambulatory Visit (INDEPENDENT_AMBULATORY_CARE_PROVIDER_SITE_OTHER): Payer: No Typology Code available for payment source | Admitting: Rheumatology

## 2016-04-19 ENCOUNTER — Ambulatory Visit: Payer: Self-pay | Admitting: Rheumatology

## 2016-04-19 VITALS — BP 140/84 | HR 92 | Resp 16 | Ht 67.0 in | Wt 308.0 lb

## 2016-04-19 DIAGNOSIS — M75121 Complete rotator cuff tear or rupture of right shoulder, not specified as traumatic: Secondary | ICD-10-CM

## 2016-04-19 DIAGNOSIS — F5101 Primary insomnia: Secondary | ICD-10-CM

## 2016-04-19 DIAGNOSIS — K1379 Other lesions of oral mucosa: Secondary | ICD-10-CM

## 2016-04-19 DIAGNOSIS — R768 Other specified abnormal immunological findings in serum: Secondary | ICD-10-CM

## 2016-04-19 DIAGNOSIS — R5383 Other fatigue: Secondary | ICD-10-CM | POA: Diagnosis not present

## 2016-04-19 MED ORDER — CYCLOBENZAPRINE HCL 10 MG PO TABS: 10.0000 mg | ORAL_TABLET | Freq: Every day | ORAL | 1 refills | Status: DC

## 2016-06-14 ENCOUNTER — Other Ambulatory Visit: Payer: Self-pay | Admitting: Rheumatology

## 2016-06-14 NOTE — Telephone Encounter (Signed)
Last Visit: 04/19/16 Next Visit due May 2018. Message sent to the front to schedule patient.    Okay to refill Flexeril?

## 2016-06-17 ENCOUNTER — Telehealth (INDEPENDENT_AMBULATORY_CARE_PROVIDER_SITE_OTHER): Payer: Self-pay | Admitting: Radiology

## 2016-06-17 DIAGNOSIS — Z9889 Other specified postprocedural states: Secondary | ICD-10-CM

## 2016-06-17 DIAGNOSIS — Z8781 Personal history of (healed) traumatic fracture: Secondary | ICD-10-CM
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
## 2016-06-19 NOTE — Telephone Encounter (Signed)
Ok to rf for 3 mos we may need to send her to pain mngmt as we too do not do long term pain mngmt - y pls rf to pain mngmt and give her 3 mo supply thx

## 2016-06-22 MED ORDER — TRAMADOL HCL 50 MG PO TABS: 100.0000 mg | ORAL_TABLET | Freq: Four times a day (QID) | ORAL | 0 refills | Status: DC | PRN

## 2016-06-22 NOTE — Addendum Note (Signed)
Addended byBrand Males on: 06/22/2016 11:28 AM   Modules accepted: Orders

## 2016-06-22 NOTE — Addendum Note (Signed)
Addended byBrand Males on: 06/22/2016 10:14 AM   Modules accepted: Orders

## 2016-06-22 NOTE — Addendum Note (Signed)
Addended byBrand Males on: 06/22/2016 10:31 AM   Modules accepted: Orders

## 2016-06-22 NOTE — Telephone Encounter (Signed)
IC patient and advised.  LMVM.  Referral to pain management entered.  Rx's done and will have Marlou Sa sign them and pt can pickup anytime after 12 noon tomorrow.

## 2016-06-22 NOTE — Addendum Note (Signed)
Addended byBrand Males on: 06/22/2016 12:55 PM   Modules accepted: Orders

## 2016-07-16 ENCOUNTER — Encounter: Payer: Self-pay | Admitting: Physical Medicine & Rehabilitation

## 2016-07-19 ENCOUNTER — Other Ambulatory Visit (INDEPENDENT_AMBULATORY_CARE_PROVIDER_SITE_OTHER): Payer: Self-pay | Admitting: Radiology

## 2016-07-19 ENCOUNTER — Other Ambulatory Visit (INDEPENDENT_AMBULATORY_CARE_PROVIDER_SITE_OTHER): Payer: Self-pay | Admitting: Orthopedic Surgery

## 2016-07-19 NOTE — Telephone Encounter (Signed)
Rx request 

## 2016-08-05 ENCOUNTER — Encounter
Payer: No Typology Code available for payment source | Attending: Physical Medicine & Rehabilitation | Admitting: Physical Medicine & Rehabilitation

## 2016-08-05 ENCOUNTER — Encounter: Payer: Self-pay | Admitting: Physical Medicine & Rehabilitation

## 2016-08-05 VITALS — BP 144/88 | HR 107 | Resp 16

## 2016-08-05 DIAGNOSIS — M791 Myalgia, unspecified site: Secondary | ICD-10-CM

## 2016-08-05 DIAGNOSIS — M25511 Pain in right shoulder: Secondary | ICD-10-CM | POA: Insufficient documentation

## 2016-08-05 DIAGNOSIS — G510 Bell's palsy: Secondary | ICD-10-CM | POA: Diagnosis not present

## 2016-08-05 DIAGNOSIS — E785 Hyperlipidemia, unspecified: Secondary | ICD-10-CM | POA: Insufficient documentation

## 2016-08-05 DIAGNOSIS — K219 Gastro-esophageal reflux disease without esophagitis: Secondary | ICD-10-CM | POA: Insufficient documentation

## 2016-08-05 DIAGNOSIS — I10 Essential (primary) hypertension: Secondary | ICD-10-CM | POA: Insufficient documentation

## 2016-08-05 DIAGNOSIS — E559 Vitamin D deficiency, unspecified: Secondary | ICD-10-CM | POA: Diagnosis not present

## 2016-08-05 DIAGNOSIS — F329 Major depressive disorder, single episode, unspecified: Secondary | ICD-10-CM | POA: Diagnosis not present

## 2016-08-05 DIAGNOSIS — G8929 Other chronic pain: Secondary | ICD-10-CM | POA: Diagnosis not present

## 2016-08-05 DIAGNOSIS — E119 Type 2 diabetes mellitus without complications: Secondary | ICD-10-CM | POA: Diagnosis not present

## 2016-08-05 DIAGNOSIS — G479 Sleep disorder, unspecified: Secondary | ICD-10-CM | POA: Diagnosis not present

## 2016-08-05 DIAGNOSIS — Z79899 Other long term (current) drug therapy: Secondary | ICD-10-CM | POA: Insufficient documentation

## 2016-08-05 DIAGNOSIS — G629 Polyneuropathy, unspecified: Secondary | ICD-10-CM | POA: Insufficient documentation

## 2016-08-05 DIAGNOSIS — G4700 Insomnia, unspecified: Secondary | ICD-10-CM | POA: Diagnosis not present

## 2016-08-05 MED ORDER — MELOXICAM 15 MG PO TABS: 15.0000 mg | ORAL_TABLET | Freq: Every day | ORAL | 1 refills | Status: DC

## 2016-08-05 MED ORDER — METHOCARBAMOL 500 MG PO TABS: 500.0000 mg | ORAL_TABLET | Freq: Three times a day (TID) | ORAL | 1 refills | Status: DC | PRN

## 2016-08-05 MED ORDER — AMITRIPTYLINE HCL 25 MG PO TABS: 25.0000 mg | ORAL_TABLET | Freq: Every day | ORAL | 1 refills | Status: DC

## 2016-08-05 MED ORDER — LIDOCAINE 5 % EX OINT: 1.0000 "application " | TOPICAL_OINTMENT | CUTANEOUS | 0 refills | Status: DC | PRN
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
## 2016-08-30 ENCOUNTER — Telehealth: Payer: Self-pay | Admitting: *Deleted

## 2016-08-30 NOTE — Telephone Encounter (Signed)
[facility] called on behalf of Tara [name] asking if she could receive 90 supply on meloxicam and amitriptyline for cost savings.

## 2016-08-30 NOTE — Telephone Encounter (Signed)
Not a present.  I have only seen the patient once and would need to assess their efficacy. Thanks.

## 2016-08-31 NOTE — Telephone Encounter (Signed)
Message left for pharmacist

## 2016-09-02 ENCOUNTER — Encounter
Payer: No Typology Code available for payment source | Attending: Physical Medicine & Rehabilitation | Admitting: Physical Medicine & Rehabilitation

## 2016-09-02 ENCOUNTER — Telehealth: Payer: Self-pay | Admitting: Physical Therapy

## 2016-09-02 ENCOUNTER — Encounter: Payer: Self-pay | Admitting: Physical Medicine & Rehabilitation

## 2016-09-02 VITALS — BP 149/84 | HR 109

## 2016-09-02 DIAGNOSIS — G8929 Other chronic pain: Secondary | ICD-10-CM | POA: Diagnosis not present

## 2016-09-02 DIAGNOSIS — M791 Myalgia, unspecified site: Secondary | ICD-10-CM

## 2016-09-02 DIAGNOSIS — G510 Bell's palsy: Secondary | ICD-10-CM | POA: Insufficient documentation

## 2016-09-02 DIAGNOSIS — Z79899 Other long term (current) drug therapy: Secondary | ICD-10-CM | POA: Insufficient documentation

## 2016-09-02 DIAGNOSIS — G4700 Insomnia, unspecified: Secondary | ICD-10-CM | POA: Insufficient documentation

## 2016-09-02 DIAGNOSIS — M25511 Pain in right shoulder: Secondary | ICD-10-CM | POA: Insufficient documentation

## 2016-09-02 DIAGNOSIS — E119 Type 2 diabetes mellitus without complications: Secondary | ICD-10-CM | POA: Insufficient documentation

## 2016-09-02 DIAGNOSIS — E785 Hyperlipidemia, unspecified: Secondary | ICD-10-CM | POA: Diagnosis not present

## 2016-09-02 DIAGNOSIS — E559 Vitamin D deficiency, unspecified: Secondary | ICD-10-CM | POA: Diagnosis not present

## 2016-09-02 DIAGNOSIS — I10 Essential (primary) hypertension: Secondary | ICD-10-CM | POA: Diagnosis not present

## 2016-09-02 DIAGNOSIS — F329 Major depressive disorder, single episode, unspecified: Secondary | ICD-10-CM | POA: Diagnosis not present

## 2016-09-02 DIAGNOSIS — K219 Gastro-esophageal reflux disease without esophagitis: Secondary | ICD-10-CM | POA: Diagnosis not present

## 2016-09-02 DIAGNOSIS — G629 Polyneuropathy, unspecified: Secondary | ICD-10-CM | POA: Diagnosis not present

## 2016-09-02 DIAGNOSIS — G479 Sleep disorder, unspecified: Secondary | ICD-10-CM

## 2016-09-02 MED ORDER — TRAMADOL HCL 50 MG PO TABS: 50.0000 mg | ORAL_TABLET | Freq: Three times a day (TID) | ORAL | 0 refills | Status: DC | PRN

## 2016-09-02 MED ORDER — MELOXICAM 15 MG PO TABS: 15.0000 mg | ORAL_TABLET | Freq: Every day | ORAL | 1 refills | Status: DC

## 2016-09-02 MED ORDER — METHOCARBAMOL 500 MG PO TABS: 500.0000 mg | ORAL_TABLET | Freq: Three times a day (TID) | ORAL | 1 refills | Status: DC | PRN

## 2016-09-02 MED ORDER — AMITRIPTYLINE HCL 25 MG PO TABS: 25.0000 mg | ORAL_TABLET | Freq: Every day | ORAL | 1 refills | Status: DC

## 2016-09-02 NOTE — Telephone Encounter (Signed)
08/19/16 spoke with pt re: scheduling PT eval. No return call
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
## 2016-09-07 ENCOUNTER — Ambulatory Visit: Payer: No Typology Code available for payment source | Admitting: Rheumatology

## 2016-10-14 ENCOUNTER — Encounter: Payer: Self-pay | Admitting: Physical Medicine & Rehabilitation

## 2016-10-14 ENCOUNTER — Encounter
Payer: No Typology Code available for payment source | Attending: Physical Medicine & Rehabilitation | Admitting: Physical Medicine & Rehabilitation

## 2016-10-14 VITALS — BP 114/70 | HR 103

## 2016-10-14 DIAGNOSIS — F329 Major depressive disorder, single episode, unspecified: Secondary | ICD-10-CM | POA: Diagnosis not present

## 2016-10-14 DIAGNOSIS — G629 Polyneuropathy, unspecified: Secondary | ICD-10-CM | POA: Insufficient documentation

## 2016-10-14 DIAGNOSIS — E785 Hyperlipidemia, unspecified: Secondary | ICD-10-CM | POA: Diagnosis not present

## 2016-10-14 DIAGNOSIS — G510 Bell's palsy: Secondary | ICD-10-CM | POA: Diagnosis not present

## 2016-10-14 DIAGNOSIS — I10 Essential (primary) hypertension: Secondary | ICD-10-CM | POA: Diagnosis not present

## 2016-10-14 DIAGNOSIS — G8929 Other chronic pain: Secondary | ICD-10-CM | POA: Diagnosis not present

## 2016-10-14 DIAGNOSIS — Z79899 Other long term (current) drug therapy: Secondary | ICD-10-CM | POA: Insufficient documentation

## 2016-10-14 DIAGNOSIS — M791 Myalgia, unspecified site: Secondary | ICD-10-CM

## 2016-10-14 DIAGNOSIS — K219 Gastro-esophageal reflux disease without esophagitis: Secondary | ICD-10-CM | POA: Diagnosis not present

## 2016-10-14 DIAGNOSIS — G479 Sleep disorder, unspecified: Secondary | ICD-10-CM

## 2016-10-14 DIAGNOSIS — E119 Type 2 diabetes mellitus without complications: Secondary | ICD-10-CM | POA: Insufficient documentation

## 2016-10-14 DIAGNOSIS — G4700 Insomnia, unspecified: Secondary | ICD-10-CM | POA: Diagnosis not present

## 2016-10-14 DIAGNOSIS — E559 Vitamin D deficiency, unspecified: Secondary | ICD-10-CM | POA: Insufficient documentation

## 2016-10-14 DIAGNOSIS — M25511 Pain in right shoulder: Secondary | ICD-10-CM | POA: Insufficient documentation
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
## 2016-10-27 ENCOUNTER — Other Ambulatory Visit: Payer: Self-pay | Admitting: Physical Medicine & Rehabilitation

## 2016-11-02 ENCOUNTER — Other Ambulatory Visit: Payer: Self-pay | Admitting: Physical Medicine & Rehabilitation

## 2016-12-03 ENCOUNTER — Other Ambulatory Visit: Payer: Self-pay | Admitting: Physical Medicine & Rehabilitation

## 2016-12-21 ENCOUNTER — Other Ambulatory Visit: Payer: Self-pay | Admitting: Physical Medicine & Rehabilitation

## 2016-12-30 ENCOUNTER — Other Ambulatory Visit: Payer: Self-pay | Admitting: Physical Medicine & Rehabilitation

## 2017-01-13 ENCOUNTER — Encounter
Payer: No Typology Code available for payment source | Attending: Physical Medicine & Rehabilitation | Admitting: Physical Medicine & Rehabilitation

## 2017-01-13 ENCOUNTER — Encounter: Payer: Self-pay | Admitting: Physical Medicine & Rehabilitation

## 2017-01-13 VITALS — BP 136/80 | HR 107

## 2017-01-13 DIAGNOSIS — G479 Sleep disorder, unspecified: Secondary | ICD-10-CM

## 2017-01-13 DIAGNOSIS — I10 Essential (primary) hypertension: Secondary | ICD-10-CM | POA: Insufficient documentation

## 2017-01-13 DIAGNOSIS — M792 Neuralgia and neuritis, unspecified: Secondary | ICD-10-CM

## 2017-01-13 DIAGNOSIS — G629 Polyneuropathy, unspecified: Secondary | ICD-10-CM | POA: Diagnosis not present

## 2017-01-13 DIAGNOSIS — K219 Gastro-esophageal reflux disease without esophagitis: Secondary | ICD-10-CM | POA: Diagnosis not present

## 2017-01-13 DIAGNOSIS — M791 Myalgia, unspecified site: Secondary | ICD-10-CM

## 2017-01-13 DIAGNOSIS — G8929 Other chronic pain: Secondary | ICD-10-CM | POA: Diagnosis present

## 2017-01-13 DIAGNOSIS — E559 Vitamin D deficiency, unspecified: Secondary | ICD-10-CM | POA: Insufficient documentation

## 2017-01-13 DIAGNOSIS — G510 Bell's palsy: Secondary | ICD-10-CM | POA: Insufficient documentation

## 2017-01-13 DIAGNOSIS — Z79899 Other long term (current) drug therapy: Secondary | ICD-10-CM | POA: Diagnosis not present

## 2017-01-13 DIAGNOSIS — G4700 Insomnia, unspecified: Secondary | ICD-10-CM | POA: Diagnosis not present

## 2017-01-13 DIAGNOSIS — E119 Type 2 diabetes mellitus without complications: Secondary | ICD-10-CM | POA: Diagnosis not present

## 2017-01-13 DIAGNOSIS — M25511 Pain in right shoulder: Secondary | ICD-10-CM | POA: Diagnosis not present

## 2017-01-13 DIAGNOSIS — F329 Major depressive disorder, single episode, unspecified: Secondary | ICD-10-CM | POA: Insufficient documentation

## 2017-01-13 DIAGNOSIS — E785 Hyperlipidemia, unspecified: Secondary | ICD-10-CM | POA: Diagnosis not present

## 2017-01-13 MED ORDER — GABAPENTIN 100 MG PO CAPS: 100.0000 mg | ORAL_CAPSULE | Freq: Three times a day (TID) | ORAL | 1 refills | Status: DC

## 2017-01-13 MED ORDER — AMITRIPTYLINE HCL 50 MG PO TABS: 50.0000 mg | ORAL_TABLET | Freq: Every day | ORAL | 1 refills | Status: DC
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
## 2017-01-16 ENCOUNTER — Other Ambulatory Visit: Payer: Self-pay | Admitting: Physical Medicine & Rehabilitation

## 2017-01-18 ENCOUNTER — Other Ambulatory Visit: Payer: Self-pay

## 2017-01-18 MED ORDER — METHOCARBAMOL 500 MG PO TABS: ORAL_TABLET | ORAL | 1 refills | Status: DC

## 2017-02-18 ENCOUNTER — Telehealth: Payer: Self-pay | Admitting: *Deleted

## 2017-02-18 NOTE — Telephone Encounter (Signed)
She may have 750mg  TID PRN.  Thanks.

## 2017-02-18 NOTE — Telephone Encounter (Signed)
Methocarbamol on back order.  Please  Advise.

## 2017-02-21 MED ORDER — METHOCARBAMOL 750 MG PO TABS: ORAL_TABLET | ORAL | 1 refills | Status: DC

## 2017-02-21 NOTE — Telephone Encounter (Signed)
Medication reordered and sent via escribed per doctors instructions

## 2017-02-21 NOTE — Addendum Note (Signed)
Addended by: Marland Mcalpine B on: 02/21/2017 01:01 PM   Modules accepted: Orders

## 2017-02-24 ENCOUNTER — Encounter: Payer: Self-pay | Admitting: Physical Medicine & Rehabilitation

## 2017-02-24 ENCOUNTER — Encounter
Payer: No Typology Code available for payment source | Attending: Physical Medicine & Rehabilitation | Admitting: Physical Medicine & Rehabilitation

## 2017-02-24 VITALS — BP 153/76 | HR 107

## 2017-02-24 DIAGNOSIS — E785 Hyperlipidemia, unspecified: Secondary | ICD-10-CM | POA: Diagnosis not present

## 2017-02-24 DIAGNOSIS — G8929 Other chronic pain: Secondary | ICD-10-CM | POA: Diagnosis not present

## 2017-02-24 DIAGNOSIS — M25511 Pain in right shoulder: Secondary | ICD-10-CM | POA: Diagnosis not present

## 2017-02-24 DIAGNOSIS — I10 Essential (primary) hypertension: Secondary | ICD-10-CM | POA: Insufficient documentation

## 2017-02-24 DIAGNOSIS — M791 Myalgia, unspecified site: Secondary | ICD-10-CM

## 2017-02-24 DIAGNOSIS — G479 Sleep disorder, unspecified: Secondary | ICD-10-CM | POA: Diagnosis not present

## 2017-02-24 DIAGNOSIS — G510 Bell's palsy: Secondary | ICD-10-CM | POA: Diagnosis not present

## 2017-02-24 DIAGNOSIS — G629 Polyneuropathy, unspecified: Secondary | ICD-10-CM | POA: Diagnosis not present

## 2017-02-24 DIAGNOSIS — G4700 Insomnia, unspecified: Secondary | ICD-10-CM | POA: Insufficient documentation

## 2017-02-24 DIAGNOSIS — M792 Neuralgia and neuritis, unspecified: Secondary | ICD-10-CM | POA: Diagnosis not present

## 2017-02-24 DIAGNOSIS — K219 Gastro-esophageal reflux disease without esophagitis: Secondary | ICD-10-CM | POA: Diagnosis not present

## 2017-02-24 DIAGNOSIS — E119 Type 2 diabetes mellitus without complications: Secondary | ICD-10-CM | POA: Insufficient documentation

## 2017-02-24 DIAGNOSIS — E559 Vitamin D deficiency, unspecified: Secondary | ICD-10-CM | POA: Insufficient documentation

## 2017-02-24 DIAGNOSIS — Z79899 Other long term (current) drug therapy: Secondary | ICD-10-CM | POA: Insufficient documentation

## 2017-02-24 DIAGNOSIS — F329 Major depressive disorder, single episode, unspecified: Secondary | ICD-10-CM | POA: Insufficient documentation
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
## 2017-03-09 ENCOUNTER — Other Ambulatory Visit: Payer: Self-pay | Admitting: Physical Medicine & Rehabilitation

## 2017-03-11 ENCOUNTER — Telehealth: Payer: Self-pay | Admitting: *Deleted

## 2017-03-11 NOTE — Telephone Encounter (Signed)
We may provide a 90 day supply.  Thanks.

## 2017-03-11 NOTE — Telephone Encounter (Signed)
Faxed request to [facility]

## 2017-03-11 NOTE — Telephone Encounter (Signed)
rec'd fax requesting a 90 day supply of meloxicam. Please advise

## 2017-04-07 ENCOUNTER — Other Ambulatory Visit: Payer: Self-pay | Admitting: *Deleted

## 2017-04-07 MED ORDER — AMITRIPTYLINE HCL 50 MG PO TABS: ORAL_TABLET | ORAL | 0 refills | Status: DC

## 2017-04-07 MED ORDER — GABAPENTIN 100 MG PO CAPS: ORAL_CAPSULE | ORAL | 0 refills | Status: DC

## 2017-04-27 ENCOUNTER — Other Ambulatory Visit: Payer: Self-pay | Admitting: Physical Medicine & Rehabilitation

## 2017-04-30 ENCOUNTER — Other Ambulatory Visit: Payer: Self-pay | Admitting: Physical Medicine & Rehabilitation

## 2017-05-04 NOTE — Telephone Encounter (Signed)
Called into pharmacy

## 2017-05-27 ENCOUNTER — Encounter: Payer: No Typology Code available for payment source | Admitting: Physical Medicine & Rehabilitation

## 2017-06-14 ENCOUNTER — Telehealth: Payer: Self-pay | Admitting: Physical Medicine & Rehabilitation

## 2017-06-14 MED ORDER — AMITRIPTYLINE HCL 50 MG PO TABS: ORAL_TABLET | ORAL | 0 refills | Status: DC

## 2017-06-14 NOTE — Addendum Note (Signed)
Addended by: Caro Hight on: 06/14/2017 05:23 PM   Modules accepted: Orders

## 2017-06-14 NOTE — Telephone Encounter (Signed)
Spoke with Tara [name] at [facility] and explain to her patient needs to be seen. Last OV was done 02/24/2017 Patient Cx 05/27/17 appt. Patient is scheduled for 06/16/16 Pleas advise for refill on Amitriptyline 50 mg Patient has been requesting refill for the past several days

## 2017-06-14 NOTE — Telephone Encounter (Signed)
We may refill it, but she should present for follow up appointment.  Thanks.

## 2017-06-14 NOTE — Telephone Encounter (Signed)
[facility] [address] CALLED (CINDY) SHE STATES THEY CALLED 02.07. AND 02.08.19 WITH NO RETURN CALL - PATIENTS 50mg  AMYTRYPTILINE  ISSUE PLEASE RETURN CALL

## 2017-06-14 NOTE — Telephone Encounter (Signed)
Pt had a 90 day supply in December.  I refilled with only 30 day supply as she has appt to see Dr Posey Pronto on 06/17/17

## 2017-06-16 ENCOUNTER — Encounter: Payer: No Typology Code available for payment source | Admitting: Physical Medicine & Rehabilitation

## 2017-06-23 ENCOUNTER — Encounter: Payer: Self-pay | Admitting: Physical Medicine & Rehabilitation

## 2017-06-23 ENCOUNTER — Encounter
Payer: No Typology Code available for payment source | Attending: Physical Medicine & Rehabilitation | Admitting: Physical Medicine & Rehabilitation

## 2017-06-23 VITALS — BP 151/91 | HR 101

## 2017-06-23 DIAGNOSIS — E1140 Type 2 diabetes mellitus with diabetic neuropathy, unspecified: Secondary | ICD-10-CM | POA: Diagnosis not present

## 2017-06-23 DIAGNOSIS — M791 Myalgia, unspecified site: Secondary | ICD-10-CM | POA: Insufficient documentation

## 2017-06-23 DIAGNOSIS — Z8719 Personal history of other diseases of the digestive system: Secondary | ICD-10-CM | POA: Diagnosis not present

## 2017-06-23 DIAGNOSIS — Z79899 Other long term (current) drug therapy: Secondary | ICD-10-CM | POA: Diagnosis not present

## 2017-06-23 DIAGNOSIS — M792 Neuralgia and neuritis, unspecified: Secondary | ICD-10-CM

## 2017-06-23 DIAGNOSIS — J45909 Unspecified asthma, uncomplicated: Secondary | ICD-10-CM | POA: Insufficient documentation

## 2017-06-23 DIAGNOSIS — I10 Essential (primary) hypertension: Secondary | ICD-10-CM | POA: Diagnosis present

## 2017-06-23 DIAGNOSIS — Z9889 Other specified postprocedural states: Secondary | ICD-10-CM | POA: Diagnosis not present

## 2017-06-23 DIAGNOSIS — G510 Bell's palsy: Secondary | ICD-10-CM | POA: Diagnosis not present

## 2017-06-23 DIAGNOSIS — M19011 Primary osteoarthritis, right shoulder: Secondary | ICD-10-CM | POA: Insufficient documentation

## 2017-06-23 DIAGNOSIS — F329 Major depressive disorder, single episode, unspecified: Secondary | ICD-10-CM | POA: Insufficient documentation

## 2017-06-23 DIAGNOSIS — Z8781 Personal history of (healed) traumatic fracture: Secondary | ICD-10-CM | POA: Insufficient documentation

## 2017-06-23 DIAGNOSIS — F4310 Post-traumatic stress disorder, unspecified: Secondary | ICD-10-CM | POA: Diagnosis not present

## 2017-06-23 DIAGNOSIS — M25511 Pain in right shoulder: Secondary | ICD-10-CM | POA: Diagnosis not present

## 2017-06-23 DIAGNOSIS — E559 Vitamin D deficiency, unspecified: Secondary | ICD-10-CM | POA: Diagnosis not present

## 2017-06-23 DIAGNOSIS — G8929 Other chronic pain: Secondary | ICD-10-CM | POA: Diagnosis not present

## 2017-06-23 DIAGNOSIS — Z5181 Encounter for therapeutic drug level monitoring: Secondary | ICD-10-CM

## 2017-06-23 DIAGNOSIS — E785 Hyperlipidemia, unspecified: Secondary | ICD-10-CM | POA: Insufficient documentation

## 2017-06-23 DIAGNOSIS — G894 Chronic pain syndrome: Secondary | ICD-10-CM

## 2017-06-23 DIAGNOSIS — Z8619 Personal history of other infectious and parasitic diseases: Secondary | ICD-10-CM | POA: Insufficient documentation

## 2017-06-23 DIAGNOSIS — K219 Gastro-esophageal reflux disease without esophagitis: Secondary | ICD-10-CM | POA: Insufficient documentation

## 2017-06-23 DIAGNOSIS — G479 Sleep disorder, unspecified: Secondary | ICD-10-CM | POA: Insufficient documentation

## 2017-06-23 DIAGNOSIS — G4700 Insomnia, unspecified: Secondary | ICD-10-CM | POA: Insufficient documentation

## 2017-06-23 DIAGNOSIS — E1142 Type 2 diabetes mellitus with diabetic polyneuropathy: Secondary | ICD-10-CM

## 2017-06-23 MED ORDER — AMITRIPTYLINE HCL 100 MG PO TABS: 100.0000 mg | ORAL_TABLET | Freq: Every day | ORAL | 2 refills | Status: DC

## 2017-06-23 MED ORDER — GABAPENTIN 300 MG PO CAPS: 300.0000 mg | ORAL_CAPSULE | Freq: Three times a day (TID) | ORAL | 2 refills | Status: DC
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
## 2017-06-29 ENCOUNTER — Telehealth: Payer: Self-pay | Admitting: *Deleted

## 2017-06-29 LAB — TOXASSURE SELECT,+ANTIDEPR,UR

## 2017-06-29 NOTE — Telephone Encounter (Signed)
Urine drug screen for this encounter is consistent for prescribed medication 

## 2017-08-30 ENCOUNTER — Other Ambulatory Visit: Payer: Self-pay | Admitting: Physical Medicine & Rehabilitation

## 2017-09-02 ENCOUNTER — Encounter
Payer: No Typology Code available for payment source | Attending: Physical Medicine & Rehabilitation | Admitting: Psychology

## 2017-09-02 DIAGNOSIS — E785 Hyperlipidemia, unspecified: Secondary | ICD-10-CM | POA: Insufficient documentation

## 2017-09-02 DIAGNOSIS — I10 Essential (primary) hypertension: Secondary | ICD-10-CM | POA: Diagnosis present

## 2017-09-02 DIAGNOSIS — M792 Neuralgia and neuritis, unspecified: Secondary | ICD-10-CM

## 2017-09-02 DIAGNOSIS — F4310 Post-traumatic stress disorder, unspecified: Secondary | ICD-10-CM | POA: Insufficient documentation

## 2017-09-02 DIAGNOSIS — M25511 Pain in right shoulder: Secondary | ICD-10-CM

## 2017-09-02 DIAGNOSIS — Z8619 Personal history of other infectious and parasitic diseases: Secondary | ICD-10-CM | POA: Insufficient documentation

## 2017-09-02 DIAGNOSIS — E559 Vitamin D deficiency, unspecified: Secondary | ICD-10-CM | POA: Diagnosis not present

## 2017-09-02 DIAGNOSIS — G8929 Other chronic pain: Secondary | ICD-10-CM

## 2017-09-02 DIAGNOSIS — Z9889 Other specified postprocedural states: Secondary | ICD-10-CM | POA: Insufficient documentation

## 2017-09-02 DIAGNOSIS — M19011 Primary osteoarthritis, right shoulder: Secondary | ICD-10-CM | POA: Insufficient documentation

## 2017-09-02 DIAGNOSIS — G4700 Insomnia, unspecified: Secondary | ICD-10-CM | POA: Diagnosis not present

## 2017-09-02 DIAGNOSIS — J45909 Unspecified asthma, uncomplicated: Secondary | ICD-10-CM | POA: Insufficient documentation

## 2017-09-02 DIAGNOSIS — Z8719 Personal history of other diseases of the digestive system: Secondary | ICD-10-CM | POA: Insufficient documentation

## 2017-09-02 DIAGNOSIS — E1140 Type 2 diabetes mellitus with diabetic neuropathy, unspecified: Secondary | ICD-10-CM | POA: Diagnosis not present

## 2017-09-02 DIAGNOSIS — G510 Bell's palsy: Secondary | ICD-10-CM | POA: Diagnosis not present

## 2017-09-02 DIAGNOSIS — K219 Gastro-esophageal reflux disease without esophagitis: Secondary | ICD-10-CM | POA: Insufficient documentation

## 2017-09-02 DIAGNOSIS — M791 Myalgia, unspecified site: Secondary | ICD-10-CM | POA: Insufficient documentation

## 2017-09-02 DIAGNOSIS — F0630 Mood disorder due to known physiological condition, unspecified: Secondary | ICD-10-CM | POA: Diagnosis not present

## 2017-09-02 DIAGNOSIS — Z8781 Personal history of (healed) traumatic fracture: Secondary | ICD-10-CM | POA: Diagnosis not present

## 2017-09-02 DIAGNOSIS — G894 Chronic pain syndrome: Secondary | ICD-10-CM | POA: Diagnosis not present

## 2017-09-02 DIAGNOSIS — F329 Major depressive disorder, single episode, unspecified: Secondary | ICD-10-CM | POA: Diagnosis not present

## 2017-09-02 DIAGNOSIS — G479 Sleep disorder, unspecified: Secondary | ICD-10-CM | POA: Diagnosis not present

## 2017-09-07 ENCOUNTER — Other Ambulatory Visit: Payer: Self-pay | Admitting: Physical Medicine & Rehabilitation

## 2017-09-16 ENCOUNTER — Encounter: Payer: No Typology Code available for payment source | Admitting: Psychology

## 2017-09-16 ENCOUNTER — Ambulatory Visit: Payer: Self-pay | Admitting: Physical Medicine & Rehabilitation

## 2017-09-22 ENCOUNTER — Encounter: Payer: No Typology Code available for payment source | Admitting: Physical Medicine & Rehabilitation

## 2017-09-23 ENCOUNTER — Other Ambulatory Visit: Payer: Self-pay | Admitting: Physical Medicine & Rehabilitation

## 2017-10-05 ENCOUNTER — Other Ambulatory Visit: Payer: Self-pay

## 2017-10-05 ENCOUNTER — Encounter: Payer: Self-pay | Admitting: Physical Medicine & Rehabilitation

## 2017-10-05 ENCOUNTER — Encounter
Payer: No Typology Code available for payment source | Attending: Physical Medicine & Rehabilitation | Admitting: Physical Medicine & Rehabilitation

## 2017-10-05 VITALS — BP 138/82 | HR 104 | Ht 67.0 in | Wt 327.2 lb

## 2017-10-05 DIAGNOSIS — Z8719 Personal history of other diseases of the digestive system: Secondary | ICD-10-CM | POA: Diagnosis not present

## 2017-10-05 DIAGNOSIS — G479 Sleep disorder, unspecified: Secondary | ICD-10-CM | POA: Insufficient documentation

## 2017-10-05 DIAGNOSIS — M19011 Primary osteoarthritis, right shoulder: Secondary | ICD-10-CM | POA: Insufficient documentation

## 2017-10-05 DIAGNOSIS — G510 Bell's palsy: Secondary | ICD-10-CM | POA: Insufficient documentation

## 2017-10-05 DIAGNOSIS — I10 Essential (primary) hypertension: Secondary | ICD-10-CM | POA: Diagnosis present

## 2017-10-05 DIAGNOSIS — E785 Hyperlipidemia, unspecified: Secondary | ICD-10-CM | POA: Insufficient documentation

## 2017-10-05 DIAGNOSIS — G8929 Other chronic pain: Secondary | ICD-10-CM | POA: Diagnosis not present

## 2017-10-05 DIAGNOSIS — K219 Gastro-esophageal reflux disease without esophagitis: Secondary | ICD-10-CM | POA: Diagnosis not present

## 2017-10-05 DIAGNOSIS — E1140 Type 2 diabetes mellitus with diabetic neuropathy, unspecified: Secondary | ICD-10-CM | POA: Diagnosis not present

## 2017-10-05 DIAGNOSIS — M791 Myalgia, unspecified site: Secondary | ICD-10-CM | POA: Insufficient documentation

## 2017-10-05 DIAGNOSIS — M25511 Pain in right shoulder: Secondary | ICD-10-CM | POA: Diagnosis not present

## 2017-10-05 DIAGNOSIS — J45909 Unspecified asthma, uncomplicated: Secondary | ICD-10-CM | POA: Insufficient documentation

## 2017-10-05 DIAGNOSIS — Z8781 Personal history of (healed) traumatic fracture: Secondary | ICD-10-CM | POA: Diagnosis not present

## 2017-10-05 DIAGNOSIS — F4310 Post-traumatic stress disorder, unspecified: Secondary | ICD-10-CM | POA: Diagnosis not present

## 2017-10-05 DIAGNOSIS — Z8619 Personal history of other infectious and parasitic diseases: Secondary | ICD-10-CM | POA: Diagnosis not present

## 2017-10-05 DIAGNOSIS — E1142 Type 2 diabetes mellitus with diabetic polyneuropathy: Secondary | ICD-10-CM

## 2017-10-05 DIAGNOSIS — Z9889 Other specified postprocedural states: Secondary | ICD-10-CM | POA: Diagnosis not present

## 2017-10-05 DIAGNOSIS — E559 Vitamin D deficiency, unspecified: Secondary | ICD-10-CM | POA: Diagnosis not present

## 2017-10-05 DIAGNOSIS — F329 Major depressive disorder, single episode, unspecified: Secondary | ICD-10-CM | POA: Diagnosis not present

## 2017-10-05 DIAGNOSIS — G4700 Insomnia, unspecified: Secondary | ICD-10-CM | POA: Diagnosis not present

## 2017-10-05 DIAGNOSIS — G894 Chronic pain syndrome: Secondary | ICD-10-CM

## 2017-10-05 DIAGNOSIS — M792 Neuralgia and neuritis, unspecified: Secondary | ICD-10-CM
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
## 2017-10-19 ENCOUNTER — Encounter: Payer: Self-pay | Admitting: Psychology
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
## 2017-10-27 ENCOUNTER — Ambulatory Visit: Payer: Self-pay | Admitting: Psychology

## 2017-11-10 ENCOUNTER — Encounter: Payer: Worker's Compensation | Admitting: Psychology

## 2017-12-27 ENCOUNTER — Telehealth: Payer: Self-pay | Admitting: *Deleted

## 2017-12-27 ENCOUNTER — Other Ambulatory Visit: Payer: Self-pay | Admitting: Physical Medicine & Rehabilitation

## 2017-12-27 NOTE — Telephone Encounter (Signed)
Thank you.  I assume she was first prescribed by someone and all insurances that I am aware of that cover specialists, cover and encourage PCP visits.

## 2017-12-27 NOTE — Telephone Encounter (Signed)
Patient left a e-mail message explaining that she is without a GP and is in need of a lisinopril refill. Apparently, she has poor insurance coverage.  I consulted our RN, Wenda Overland.  She advised calling the patient and direct them to [facility] or [facility].  I contacted the patient and advised. I advised the patient to contact us back.

## 2017-12-30 ENCOUNTER — Ambulatory Visit: Payer: Worker's Compensation | Admitting: Psychology

## 2018-03-05 ENCOUNTER — Other Ambulatory Visit: Payer: Self-pay | Admitting: Physical Medicine & Rehabilitation

## 2018-03-07 ENCOUNTER — Other Ambulatory Visit: Payer: Self-pay | Admitting: Physical Medicine & Rehabilitation

## 2018-03-28 ENCOUNTER — Other Ambulatory Visit: Payer: Self-pay

## 2018-03-28 MED ORDER — MELOXICAM 15 MG PO TABS: 15.0000 mg | ORAL_TABLET | Freq: Every day | ORAL | 1 refills | Status: DC

## 2018-04-06 ENCOUNTER — Encounter: Payer: Self-pay | Admitting: Physical Medicine & Rehabilitation

## 2018-04-06 ENCOUNTER — Encounter
Payer: No Typology Code available for payment source | Attending: Physical Medicine & Rehabilitation | Admitting: Physical Medicine & Rehabilitation

## 2018-04-06 ENCOUNTER — Other Ambulatory Visit: Payer: Self-pay

## 2018-04-06 VITALS — BP 114/76 | HR 102 | Ht 67.0 in | Wt 319.0 lb

## 2018-04-06 DIAGNOSIS — E559 Vitamin D deficiency, unspecified: Secondary | ICD-10-CM | POA: Diagnosis not present

## 2018-04-06 DIAGNOSIS — I10 Essential (primary) hypertension: Secondary | ICD-10-CM | POA: Insufficient documentation

## 2018-04-06 DIAGNOSIS — M25512 Pain in left shoulder: Secondary | ICD-10-CM | POA: Diagnosis not present

## 2018-04-06 DIAGNOSIS — F329 Major depressive disorder, single episode, unspecified: Secondary | ICD-10-CM | POA: Diagnosis not present

## 2018-04-06 DIAGNOSIS — K219 Gastro-esophageal reflux disease without esophagitis: Secondary | ICD-10-CM | POA: Diagnosis not present

## 2018-04-06 DIAGNOSIS — M25511 Pain in right shoulder: Secondary | ICD-10-CM

## 2018-04-06 DIAGNOSIS — G479 Sleep disorder, unspecified: Secondary | ICD-10-CM | POA: Diagnosis not present

## 2018-04-06 DIAGNOSIS — G894 Chronic pain syndrome: Secondary | ICD-10-CM

## 2018-04-06 DIAGNOSIS — Z8711 Personal history of peptic ulcer disease: Secondary | ICD-10-CM | POA: Insufficient documentation

## 2018-04-06 DIAGNOSIS — Z7984 Long term (current) use of oral hypoglycemic drugs: Secondary | ICD-10-CM | POA: Diagnosis not present

## 2018-04-06 DIAGNOSIS — G4730 Sleep apnea, unspecified: Secondary | ICD-10-CM | POA: Insufficient documentation

## 2018-04-06 DIAGNOSIS — M791 Myalgia, unspecified site: Secondary | ICD-10-CM | POA: Diagnosis not present

## 2018-04-06 DIAGNOSIS — Z79899 Other long term (current) drug therapy: Secondary | ICD-10-CM | POA: Insufficient documentation

## 2018-04-06 DIAGNOSIS — Z6841 Body Mass Index (BMI) 40.0 and over, adult: Secondary | ICD-10-CM | POA: Diagnosis not present

## 2018-04-06 DIAGNOSIS — E785 Hyperlipidemia, unspecified: Secondary | ICD-10-CM | POA: Insufficient documentation

## 2018-04-06 DIAGNOSIS — M792 Neuralgia and neuritis, unspecified: Secondary | ICD-10-CM

## 2018-04-06 DIAGNOSIS — Z8781 Personal history of (healed) traumatic fracture: Secondary | ICD-10-CM | POA: Diagnosis not present

## 2018-04-06 DIAGNOSIS — E1140 Type 2 diabetes mellitus with diabetic neuropathy, unspecified: Secondary | ICD-10-CM | POA: Diagnosis not present

## 2018-04-06 DIAGNOSIS — G8929 Other chronic pain: Secondary | ICD-10-CM

## 2018-04-06 DIAGNOSIS — M1990 Unspecified osteoarthritis, unspecified site: Secondary | ICD-10-CM | POA: Insufficient documentation

## 2018-04-06 DIAGNOSIS — G4700 Insomnia, unspecified: Secondary | ICD-10-CM | POA: Insufficient documentation

## 2018-04-06 DIAGNOSIS — E1142 Type 2 diabetes mellitus with diabetic polyneuropathy: Secondary | ICD-10-CM

## 2018-04-06 DIAGNOSIS — L03116 Cellulitis of left lower limb: Secondary | ICD-10-CM | POA: Insufficient documentation

## 2018-04-06 MED ORDER — GABAPENTIN 600 MG PO TABS: 600.0000 mg | ORAL_TABLET | Freq: Three times a day (TID) | ORAL | 1 refills | Status: DC

## 2018-04-06 MED ORDER — TRAMADOL HCL 50 MG PO TABS: 50.0000 mg | ORAL_TABLET | Freq: Every day | ORAL | 0 refills | Status: DC | PRN
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
## 2018-04-07 ENCOUNTER — Other Ambulatory Visit: Payer: Self-pay | Admitting: Physical Medicine & Rehabilitation

## 2018-04-10 ENCOUNTER — Other Ambulatory Visit: Payer: Self-pay | Admitting: *Deleted

## 2018-04-10 MED ORDER — GABAPENTIN 600 MG PO TABS: 600.0000 mg | ORAL_TABLET | Freq: Three times a day (TID) | ORAL | 0 refills | Status: DC

## 2018-05-18 ENCOUNTER — Ambulatory Visit: Payer: No Typology Code available for payment source | Admitting: Physical Medicine & Rehabilitation

## 2018-06-05 ENCOUNTER — Other Ambulatory Visit: Payer: Self-pay | Admitting: Physical Medicine & Rehabilitation

## 2018-09-02 ENCOUNTER — Other Ambulatory Visit: Payer: Self-pay | Admitting: Physical Medicine & Rehabilitation

## 2018-09-03 NOTE — Telephone Encounter (Signed)
Not my pt  Looks like since I changed cost center refill requests from [place] pt are coming to me

## 2018-12-12 ENCOUNTER — Encounter: Payer: Medicare HMO | Attending: Physical Medicine & Rehabilitation | Admitting: Physical Medicine & Rehabilitation

## 2018-12-12 ENCOUNTER — Other Ambulatory Visit: Payer: Self-pay

## 2018-12-12 ENCOUNTER — Encounter: Payer: Self-pay | Admitting: Physical Medicine & Rehabilitation

## 2018-12-12 VITALS — BP 169/81 | HR 103 | Temp 98.8°F | Ht 67.0 in | Wt 323.0 lb

## 2018-12-12 DIAGNOSIS — M25511 Pain in right shoulder: Secondary | ICD-10-CM

## 2018-12-12 DIAGNOSIS — M792 Neuralgia and neuritis, unspecified: Secondary | ICD-10-CM

## 2018-12-12 DIAGNOSIS — G894 Chronic pain syndrome: Secondary | ICD-10-CM | POA: Diagnosis present

## 2018-12-12 DIAGNOSIS — G5601 Carpal tunnel syndrome, right upper limb: Secondary | ICD-10-CM | POA: Diagnosis present

## 2018-12-12 DIAGNOSIS — G479 Sleep disorder, unspecified: Secondary | ICD-10-CM | POA: Insufficient documentation

## 2018-12-12 DIAGNOSIS — E1142 Type 2 diabetes mellitus with diabetic polyneuropathy: Secondary | ICD-10-CM | POA: Insufficient documentation

## 2018-12-12 DIAGNOSIS — G8929 Other chronic pain: Secondary | ICD-10-CM | POA: Diagnosis present

## 2018-12-12 MED ORDER — TRAMADOL HCL 50 MG PO TABS: 50.0000 mg | ORAL_TABLET | Freq: Every day | ORAL | 5 refills | Status: DC | PRN

## 2018-12-12 NOTE — Addendum Note (Signed)
Addended by: Delice Lesch A on: 12/12/2018 10:31 AM   Modules accepted: Orders
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
## 2018-12-13 ENCOUNTER — Telehealth: Payer: Self-pay

## 2018-12-13 NOTE — Telephone Encounter (Signed)
Patient called stating that Tramadol sent to wrong pharmacy. She uses [facility]. Called [facility] [facility] an cancelled prescription.

## 2018-12-19 ENCOUNTER — Telehealth: Payer: Self-pay | Admitting: *Deleted

## 2018-12-19 MED ORDER — TRAMADOL HCL 50 MG PO TABS: 50.0000 mg | ORAL_TABLET | Freq: Every day | ORAL | 5 refills | Status: DC | PRN

## 2018-12-19 NOTE — Telephone Encounter (Signed)
Tramadol was on 'phone in'  when ordered by Dr Posey Pronto on 12/12/18 and did not get ordered.  I have called to [facility] as patient has requested in [employer].

## 2019-01-02 ENCOUNTER — Ambulatory Visit: Payer: Medicare HMO | Attending: Physical Medicine & Rehabilitation | Admitting: Physical Therapy

## 2019-01-02 ENCOUNTER — Other Ambulatory Visit: Payer: Self-pay

## 2019-01-02 ENCOUNTER — Encounter: Payer: Self-pay | Admitting: Physical Therapy

## 2019-01-02 DIAGNOSIS — M25611 Stiffness of right shoulder, not elsewhere classified: Secondary | ICD-10-CM | POA: Diagnosis present

## 2019-01-02 DIAGNOSIS — M25531 Pain in right wrist: Secondary | ICD-10-CM | POA: Diagnosis present

## 2019-01-02 DIAGNOSIS — G8929 Other chronic pain: Secondary | ICD-10-CM | POA: Diagnosis present

## 2019-01-02 DIAGNOSIS — M6281 Muscle weakness (generalized): Secondary | ICD-10-CM | POA: Diagnosis present

## 2019-01-02 DIAGNOSIS — M25511 Pain in right shoulder: Secondary | ICD-10-CM | POA: Insufficient documentation
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
## 2019-01-15 ENCOUNTER — Other Ambulatory Visit: Payer: Self-pay | Admitting: *Deleted

## 2019-01-15 MED ORDER — MELOXICAM 15 MG PO TABS: 15.0000 mg | ORAL_TABLET | Freq: Every day | ORAL | 1 refills | Status: DC

## 2019-01-16 ENCOUNTER — Ambulatory Visit: Payer: Medicare HMO | Admitting: Physical Therapy

## 2019-01-16 ENCOUNTER — Other Ambulatory Visit: Payer: Self-pay

## 2019-01-16 ENCOUNTER — Encounter: Payer: Self-pay | Admitting: Physical Therapy

## 2019-01-16 DIAGNOSIS — M25531 Pain in right wrist: Secondary | ICD-10-CM

## 2019-01-16 DIAGNOSIS — M6281 Muscle weakness (generalized): Secondary | ICD-10-CM

## 2019-01-16 DIAGNOSIS — G8929 Other chronic pain: Secondary | ICD-10-CM

## 2019-01-16 DIAGNOSIS — M25511 Pain in right shoulder: Secondary | ICD-10-CM | POA: Diagnosis not present

## 2019-01-16 DIAGNOSIS — M25611 Stiffness of right shoulder, not elsewhere classified: Secondary | ICD-10-CM
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
## 2019-01-30 ENCOUNTER — Ambulatory Visit: Payer: Medicare HMO | Admitting: Physical Therapy

## 2019-02-13 ENCOUNTER — Ambulatory Visit: Payer: Medicare HMO | Attending: Physical Medicine & Rehabilitation | Admitting: Physical Therapy

## 2019-02-13 ENCOUNTER — Other Ambulatory Visit: Payer: Self-pay

## 2019-02-13 DIAGNOSIS — M25611 Stiffness of right shoulder, not elsewhere classified: Secondary | ICD-10-CM | POA: Insufficient documentation

## 2019-02-13 DIAGNOSIS — M25511 Pain in right shoulder: Secondary | ICD-10-CM | POA: Diagnosis not present

## 2019-02-13 DIAGNOSIS — G8929 Other chronic pain: Secondary | ICD-10-CM | POA: Insufficient documentation

## 2019-02-13 DIAGNOSIS — M6281 Muscle weakness (generalized): Secondary | ICD-10-CM | POA: Diagnosis present

## 2019-02-13 DIAGNOSIS — M25531 Pain in right wrist: Secondary | ICD-10-CM | POA: Diagnosis present
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
## 2019-02-27 ENCOUNTER — Encounter: Payer: Self-pay | Admitting: Physical Therapy

## 2019-02-27 ENCOUNTER — Ambulatory Visit: Payer: Medicare HMO | Admitting: Physical Therapy

## 2019-02-27 ENCOUNTER — Other Ambulatory Visit: Payer: Self-pay

## 2019-02-27 DIAGNOSIS — M25511 Pain in right shoulder: Secondary | ICD-10-CM

## 2019-02-27 DIAGNOSIS — M25611 Stiffness of right shoulder, not elsewhere classified: Secondary | ICD-10-CM

## 2019-02-27 DIAGNOSIS — M6281 Muscle weakness (generalized): Secondary | ICD-10-CM

## 2019-02-27 DIAGNOSIS — G8929 Other chronic pain: Secondary | ICD-10-CM

## 2019-02-27 DIAGNOSIS — M25531 Pain in right wrist: Secondary | ICD-10-CM

## 2019-02-28 ENCOUNTER — Encounter: Payer: Self-pay | Admitting: Physical Therapy
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
## 2019-06-15 ENCOUNTER — Ambulatory Visit: Payer: Medicare HMO | Admitting: Physical Medicine & Rehabilitation

## 2019-06-19 ENCOUNTER — Encounter: Payer: Medicare HMO | Attending: Physical Medicine & Rehabilitation | Admitting: Physical Medicine & Rehabilitation

## 2019-06-19 ENCOUNTER — Encounter: Payer: Self-pay | Admitting: Physical Medicine & Rehabilitation

## 2019-06-19 ENCOUNTER — Other Ambulatory Visit: Payer: Self-pay

## 2019-06-19 VITALS — BP 133/84 | HR 106 | Temp 97.5°F | Ht 67.0 in | Wt 319.0 lb

## 2019-06-19 DIAGNOSIS — G479 Sleep disorder, unspecified: Secondary | ICD-10-CM

## 2019-06-19 DIAGNOSIS — R269 Unspecified abnormalities of gait and mobility: Secondary | ICD-10-CM

## 2019-06-19 DIAGNOSIS — M791 Myalgia, unspecified site: Secondary | ICD-10-CM

## 2019-06-19 DIAGNOSIS — G894 Chronic pain syndrome: Secondary | ICD-10-CM | POA: Diagnosis not present

## 2019-06-19 DIAGNOSIS — G8929 Other chronic pain: Secondary | ICD-10-CM

## 2019-06-19 DIAGNOSIS — M792 Neuralgia and neuritis, unspecified: Secondary | ICD-10-CM

## 2019-06-19 DIAGNOSIS — E1142 Type 2 diabetes mellitus with diabetic polyneuropathy: Secondary | ICD-10-CM | POA: Insufficient documentation

## 2019-06-19 DIAGNOSIS — M25511 Pain in right shoulder: Secondary | ICD-10-CM | POA: Diagnosis not present

## 2019-06-19 DIAGNOSIS — G5601 Carpal tunnel syndrome, right upper limb: Secondary | ICD-10-CM

## 2019-06-19 MED ORDER — GABAPENTIN 600 MG PO TABS: 600.0000 mg | ORAL_TABLET | Freq: Three times a day (TID) | ORAL | 0 refills | Status: DC
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
## 2019-06-22 ENCOUNTER — Telehealth: Payer: Self-pay

## 2019-06-22 NOTE — Telephone Encounter (Signed)
She informed me that her gabapentin was changed to 300 3 times daily by pharmacy but she has better results with 600 3 times daily.  Therefore, I changed her to 600 3 times daily.  She already has a prescription for 300 and has picked that up, then she can finish that course before feeling the new prescription.  If she has not picked up the old prescription (300), that can be canceled.  Thanks.

## 2019-06-22 NOTE — Telephone Encounter (Signed)
[employer] sent a fax asking for clarification on Gabapentin. On 06/19/2019 Gabapentin 600 mg sent from Dr. Posey Pronto and on 06/13/2019 Gabapentin 300 mg was sent by Dr. Armandina Stammer. Please clarity current therapy.

## 2019-06-25 NOTE — Telephone Encounter (Signed)
Faxed back the response

## 2019-06-28 LAB — COMPREHENSIVE METABOLIC PANEL
ALT: 37 IU/L — ABNORMAL HIGH (ref 0–32)
AST: 24 IU/L (ref 0–40)
Albumin/Globulin Ratio: 1.5 (ref 1.2–2.2)
Albumin: 4 g/dL (ref 3.8–4.8)
Alkaline Phosphatase: 135 IU/L — ABNORMAL HIGH (ref 39–117)
BUN/Creatinine Ratio: 19 (ref 12–28)
BUN: 12 mg/dL (ref 8–27)
Bilirubin Total: 0.2 mg/dL (ref 0.0–1.2)
CO2: 24 mmol/L (ref 20–29)
Calcium: 9.3 mg/dL (ref 8.7–10.3)
Chloride: 98 mmol/L (ref 96–106)
Creatinine, Ser: 0.63 mg/dL (ref 0.57–1.00)
GFR calc Af Amer: 110 mL/min/{1.73_m2} (ref >59)
GFR calc non Af Amer: 95 mL/min/{1.73_m2} (ref >59)
Globulin, Total: 2.7 g/dL (ref 1.5–4.5)
Glucose: 257 mg/dL — ABNORMAL HIGH (ref 65–99)
Potassium: 4.6 mmol/L (ref 3.5–5.2)
Sodium: 141 mmol/L (ref 134–144)
Total Protein: 6.7 g/dL (ref 6.0–8.5)

## 2019-07-18 ENCOUNTER — Other Ambulatory Visit: Payer: Self-pay | Admitting: Physical Medicine & Rehabilitation

## 2019-07-18 NOTE — Telephone Encounter (Signed)
Recieved electronic medication refill request for Tramadol medication.  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  Please advise.

## 2019-07-20 ENCOUNTER — Other Ambulatory Visit: Payer: Self-pay | Admitting: Physical Medicine & Rehabilitation

## 2019-07-31 ENCOUNTER — Telehealth: Payer: Self-pay

## 2019-07-31 MED ORDER — METHOCARBAMOL 750 MG PO TABS: ORAL_TABLET | ORAL | 1 refills | Status: DC

## 2019-07-31 NOTE — Telephone Encounter (Signed)
Rx sent 

## 2019-08-06 ENCOUNTER — Telehealth: Payer: Self-pay

## 2019-08-06 NOTE — Telephone Encounter (Signed)
Refill request for Tramadol. [place].

## 2019-08-13 NOTE — Telephone Encounter (Signed)
Patient stated she was rarely using, so this is a request from the pharmacy we can deny it.

## 2019-09-13 ENCOUNTER — Encounter: Payer: Self-pay | Admitting: Physical Medicine & Rehabilitation

## 2019-09-13 ENCOUNTER — Other Ambulatory Visit: Payer: Self-pay

## 2019-09-13 ENCOUNTER — Encounter: Payer: Medicare HMO | Attending: Physical Medicine & Rehabilitation | Admitting: Physical Medicine & Rehabilitation

## 2019-09-13 VITALS — BP 137/80 | HR 99 | Temp 98.2°F | Ht 67.0 in | Wt 321.0 lb

## 2019-09-13 DIAGNOSIS — M25511 Pain in right shoulder: Secondary | ICD-10-CM | POA: Insufficient documentation

## 2019-09-13 DIAGNOSIS — G8929 Other chronic pain: Secondary | ICD-10-CM | POA: Diagnosis present

## 2019-09-13 DIAGNOSIS — E1142 Type 2 diabetes mellitus with diabetic polyneuropathy: Secondary | ICD-10-CM

## 2019-09-13 DIAGNOSIS — G5601 Carpal tunnel syndrome, right upper limb: Secondary | ICD-10-CM | POA: Insufficient documentation

## 2019-09-13 DIAGNOSIS — R269 Unspecified abnormalities of gait and mobility: Secondary | ICD-10-CM | POA: Diagnosis present

## 2019-09-13 DIAGNOSIS — M791 Myalgia, unspecified site: Secondary | ICD-10-CM | POA: Insufficient documentation

## 2019-09-13 DIAGNOSIS — G479 Sleep disorder, unspecified: Secondary | ICD-10-CM

## 2019-09-13 DIAGNOSIS — G894 Chronic pain syndrome: Secondary | ICD-10-CM | POA: Diagnosis not present

## 2019-09-13 DIAGNOSIS — Z5181 Encounter for therapeutic drug level monitoring: Secondary | ICD-10-CM | POA: Insufficient documentation

## 2019-09-13 DIAGNOSIS — Z79891 Long term (current) use of opiate analgesic: Secondary | ICD-10-CM | POA: Diagnosis present

## 2019-09-13 DIAGNOSIS — M792 Neuralgia and neuritis, unspecified: Secondary | ICD-10-CM | POA: Diagnosis present

## 2019-09-13 MED ORDER — TRAMADOL HCL 50 MG PO TABS: 50.0000 mg | ORAL_TABLET | Freq: Every day | ORAL | 3 refills | Status: DC | PRN

## 2019-09-13 NOTE — Addendum Note (Signed)
Addended by: Jasmine December T on: 09/13/2019 12:18 PM   Modules accepted: Orders

## 2019-09-13 NOTE — Addendum Note (Signed)
Addended by: Jasmine December T on: 09/13/2019 11:54 AM   Modules accepted: Orders
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
## 2019-09-17 LAB — DRUG TOX MONITOR 1 W/CONF, ORAL FLD

## 2019-09-17 LAB — DRUG TOX ALC METAB W/CON, ORAL FLD: Alcohol Metabolite: NEGATIVE ng/mL (ref <25)

## 2019-09-18 ENCOUNTER — Ambulatory Visit: Payer: Medicare HMO | Admitting: Physical Medicine & Rehabilitation

## 2019-10-15 MED ORDER — TRAMADOL HCL 50 MG PO TABS: 50.0000 mg | ORAL_TABLET | Freq: Every day | ORAL | 3 refills | Status: DC | PRN

## 2019-10-15 NOTE — Addendum Note (Signed)
Addended by: Delice Lesch A on: 10/15/2019 09:36 AM   Modules accepted: Orders

## 2019-10-17 ENCOUNTER — Other Ambulatory Visit: Payer: Self-pay

## 2019-12-19 ENCOUNTER — Other Ambulatory Visit: Payer: Self-pay | Admitting: Physical Medicine & Rehabilitation

## 2020-01-14 MED ORDER — GABAPENTIN 600 MG PO TABS: 600.0000 mg | ORAL_TABLET | Freq: Three times a day (TID) | ORAL | 0 refills | Status: DC

## 2020-02-11 ENCOUNTER — Other Ambulatory Visit: Payer: Self-pay | Admitting: Physical Medicine & Rehabilitation

## 2020-02-14 ENCOUNTER — Other Ambulatory Visit: Payer: Self-pay

## 2020-02-14 ENCOUNTER — Encounter: Payer: Self-pay | Admitting: Physical Medicine & Rehabilitation

## 2020-02-14 ENCOUNTER — Encounter: Payer: Medicare HMO | Attending: Physical Medicine & Rehabilitation | Admitting: Physical Medicine & Rehabilitation

## 2020-02-14 VITALS — BP 137/78 | HR 102 | Temp 98.9°F | Ht 67.0 in | Wt 319.4 lb

## 2020-02-14 DIAGNOSIS — G8929 Other chronic pain: Secondary | ICD-10-CM | POA: Diagnosis present

## 2020-02-14 DIAGNOSIS — S46012A Strain of muscle(s) and tendon(s) of the rotator cuff of left shoulder, initial encounter: Secondary | ICD-10-CM | POA: Insufficient documentation

## 2020-02-14 DIAGNOSIS — M792 Neuralgia and neuritis, unspecified: Secondary | ICD-10-CM | POA: Insufficient documentation

## 2020-02-14 DIAGNOSIS — G479 Sleep disorder, unspecified: Secondary | ICD-10-CM | POA: Diagnosis not present

## 2020-02-14 DIAGNOSIS — E1142 Type 2 diabetes mellitus with diabetic polyneuropathy: Secondary | ICD-10-CM | POA: Diagnosis present

## 2020-02-14 DIAGNOSIS — G894 Chronic pain syndrome: Secondary | ICD-10-CM | POA: Diagnosis present

## 2020-02-14 DIAGNOSIS — M25511 Pain in right shoulder: Secondary | ICD-10-CM | POA: Insufficient documentation

## 2020-02-14 DIAGNOSIS — M791 Myalgia, unspecified site: Secondary | ICD-10-CM | POA: Diagnosis present

## 2020-02-14 DIAGNOSIS — R269 Unspecified abnormalities of gait and mobility: Secondary | ICD-10-CM | POA: Insufficient documentation

## 2020-02-14 DIAGNOSIS — S46012D Strain of muscle(s) and tendon(s) of the rotator cuff of left shoulder, subsequent encounter: Secondary | ICD-10-CM | POA: Diagnosis present
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
## 2020-03-26 ENCOUNTER — Other Ambulatory Visit: Payer: Self-pay | Admitting: Physical Medicine & Rehabilitation

## 2020-05-16 ENCOUNTER — Other Ambulatory Visit: Payer: Self-pay | Admitting: Physical Medicine & Rehabilitation

## 2020-05-17 ENCOUNTER — Other Ambulatory Visit: Payer: Self-pay | Admitting: Physical Medicine & Rehabilitation

## 2020-05-21 ENCOUNTER — Other Ambulatory Visit: Payer: Self-pay | Admitting: *Deleted

## 2020-05-21 MED ORDER — TRAMADOL HCL 50 MG PO TABS
50.0000 mg | ORAL_TABLET | Freq: Every day | ORAL | 3 refills | Status: DC | PRN
Start: 2020-05-21 — End: 2020-05-22

## 2020-05-21 NOTE — Telephone Encounter (Signed)
Incoming fax (multiple) patient requesting refill of tramadol to be sent to [facility] mail order pharmacy

## 2020-05-22 ENCOUNTER — Other Ambulatory Visit: Payer: Self-pay | Admitting: *Deleted

## 2020-05-22 MED ORDER — TRAMADOL HCL 50 MG PO TABS
50.0000 mg | ORAL_TABLET | Freq: Every day | ORAL | 3 refills | Status: DC | PRN
Start: 2020-05-22 — End: 2020-08-21

## 2020-07-09 ENCOUNTER — Other Ambulatory Visit: Payer: Self-pay | Admitting: Physical Medicine & Rehabilitation

## 2020-08-14 ENCOUNTER — Ambulatory Visit: Payer: Medicare HMO | Admitting: Physical Medicine & Rehabilitation

## 2020-08-21 ENCOUNTER — Encounter: Payer: Medicare HMO | Attending: Physical Medicine & Rehabilitation | Admitting: Physical Medicine & Rehabilitation

## 2020-08-21 ENCOUNTER — Encounter: Payer: Self-pay | Admitting: Physical Medicine & Rehabilitation

## 2020-08-21 ENCOUNTER — Other Ambulatory Visit: Payer: Self-pay

## 2020-08-21 VITALS — BP 159/79 | HR 99 | Temp 98.7°F | Ht 67.0 in | Wt 307.8 lb

## 2020-08-21 DIAGNOSIS — G479 Sleep disorder, unspecified: Secondary | ICD-10-CM

## 2020-08-21 DIAGNOSIS — G8929 Other chronic pain: Secondary | ICD-10-CM

## 2020-08-21 DIAGNOSIS — E1142 Type 2 diabetes mellitus with diabetic polyneuropathy: Secondary | ICD-10-CM | POA: Diagnosis present

## 2020-08-21 DIAGNOSIS — M792 Neuralgia and neuritis, unspecified: Secondary | ICD-10-CM

## 2020-08-21 DIAGNOSIS — M25511 Pain in right shoulder: Secondary | ICD-10-CM | POA: Diagnosis not present

## 2020-08-21 DIAGNOSIS — G894 Chronic pain syndrome: Secondary | ICD-10-CM

## 2020-08-21 DIAGNOSIS — R269 Unspecified abnormalities of gait and mobility: Secondary | ICD-10-CM

## 2020-08-21 MED ORDER — TRAMADOL HCL 50 MG PO TABS: 50.0000 mg | ORAL_TABLET | Freq: Every day | ORAL | 3 refills | Status: DC | PRN
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
## 2020-08-23 ENCOUNTER — Other Ambulatory Visit: Payer: Self-pay | Admitting: Physical Medicine & Rehabilitation

## 2020-09-20 ENCOUNTER — Encounter (HOSPITAL_BASED_OUTPATIENT_CLINIC_OR_DEPARTMENT_OTHER): Payer: Self-pay | Admitting: Emergency Medicine

## 2020-09-20 ENCOUNTER — Emergency Department (HOSPITAL_BASED_OUTPATIENT_CLINIC_OR_DEPARTMENT_OTHER): Payer: Medicare HMO

## 2020-09-20 ENCOUNTER — Emergency Department (HOSPITAL_BASED_OUTPATIENT_CLINIC_OR_DEPARTMENT_OTHER)
Admission: EM | Admit: 2020-09-20 | Discharge: 2020-09-20 | Disposition: A | Discharge status: Home / Self Care | Payer: Medicare HMO | Attending: Emergency Medicine | Admitting: Emergency Medicine

## 2020-09-20 ENCOUNTER — Other Ambulatory Visit: Payer: Self-pay

## 2020-09-20 DIAGNOSIS — Z79899 Other long term (current) drug therapy: Secondary | ICD-10-CM | POA: Insufficient documentation

## 2020-09-20 DIAGNOSIS — I10 Essential (primary) hypertension: Secondary | ICD-10-CM | POA: Insufficient documentation

## 2020-09-20 DIAGNOSIS — M545 Low back pain, unspecified: Secondary | ICD-10-CM | POA: Insufficient documentation

## 2020-09-20 DIAGNOSIS — E1140 Type 2 diabetes mellitus with diabetic neuropathy, unspecified: Secondary | ICD-10-CM | POA: Insufficient documentation

## 2020-09-20 DIAGNOSIS — W050XXA Fall from non-moving wheelchair, initial encounter: Secondary | ICD-10-CM | POA: Diagnosis not present

## 2020-09-20 DIAGNOSIS — Z7984 Long term (current) use of oral hypoglycemic drugs: Secondary | ICD-10-CM | POA: Insufficient documentation

## 2020-09-20 DIAGNOSIS — W19XXXA Unspecified fall, initial encounter: Secondary | ICD-10-CM

## 2020-09-20 DIAGNOSIS — R112 Nausea with vomiting, unspecified: Secondary | ICD-10-CM | POA: Insufficient documentation

## 2020-09-20 LAB — LIPASE, BLOOD: Lipase: 26 U/L (ref 11–51)

## 2020-09-20 LAB — CBC WITH DIFFERENTIAL/PLATELET
Abs Immature Granulocytes: 0.08 10*3/uL — ABNORMAL HIGH (ref 0.00–0.07)
Basophils Absolute: 0.1 10*3/uL (ref 0.0–0.1)
Basophils Relative: 1 %
Eosinophils Absolute: 0.1 10*3/uL (ref 0.0–0.5)
Eosinophils Relative: 1 %
HCT: 47.5 % — ABNORMAL HIGH (ref 36.0–46.0)
Hemoglobin: 15.7 g/dL — ABNORMAL HIGH (ref 12.0–15.0)
Immature Granulocytes: 0 %
Lymphocytes Relative: 10 %
Lymphs Abs: 1.7 10*3/uL (ref 0.7–4.0)
MCH: 29.3 pg (ref 26.0–34.0)
MCHC: 33.1 g/dL (ref 30.0–36.0)
MCV: 88.8 fL (ref 80.0–100.0)
Monocytes Absolute: 1.1 10*3/uL — ABNORMAL HIGH (ref 0.1–1.0)
Monocytes Relative: 6 %
Neutro Abs: 15 10*3/uL — ABNORMAL HIGH (ref 1.7–7.7)
Neutrophils Relative %: 82 %
Platelets: 299 10*3/uL (ref 150–400)
RBC: 5.35 MIL/uL — ABNORMAL HIGH (ref 3.87–5.11)
RDW: 14.5 % (ref 11.5–15.5)
WBC: 18.1 10*3/uL — ABNORMAL HIGH (ref 4.0–10.5)
nRBC: 0 % (ref 0.0–0.2)

## 2020-09-20 LAB — COMPREHENSIVE METABOLIC PANEL
ALT: 29 U/L (ref 0–44)
AST: 32 U/L (ref 15–41)
Albumin: 3.9 g/dL (ref 3.5–5.0)
Alkaline Phosphatase: 109 U/L (ref 38–126)
Anion gap: 15 (ref 5–15)
BUN: 13 mg/dL (ref 8–23)
CO2: 19 mmol/L — ABNORMAL LOW (ref 22–32)
Calcium: 8.9 mg/dL (ref 8.9–10.3)
Chloride: 104 mmol/L (ref 98–111)
Creatinine, Ser: 0.68 mg/dL (ref 0.44–1.00)
GFR, Estimated: >60 mL/min (ref >60)
Glucose, Bld: 213 mg/dL — ABNORMAL HIGH (ref 70–99)
Potassium: 4 mmol/L (ref 3.5–5.1)
Sodium: 138 mmol/L (ref 135–145)
Total Bilirubin: 0.4 mg/dL (ref 0.3–1.2)
Total Protein: 7.6 g/dL (ref 6.5–8.1)

## 2020-09-20 MED ORDER — ONDANSETRON HCL 4 MG/2ML IJ SOLN
4.0000 mg | Freq: Once | INTRAMUSCULAR | Status: AC
  Administered 2020-09-20: 4 mg via INTRAVENOUS
  Filled 2020-09-20: qty 2

## 2020-09-20 MED ORDER — MORPHINE SULFATE (PF) 4 MG/ML IV SOLN
4.0000 mg | Freq: Once | INTRAVENOUS | Status: AC
  Administered 2020-09-20: 4 mg via INTRAVENOUS
  Filled 2020-09-20: qty 1
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
## 2020-09-20 NOTE — ED Notes (Signed)
Took robaxin last night with little relief.  Able to ambulate although with pain.  Using a cane at home

## 2020-09-20 NOTE — ED Triage Notes (Signed)
Arrived via [facility] with s/p fall x2 weeks ago, lower back pain. CBG 220, BP 143/85, HR 102

## 2020-09-20 NOTE — Discharge Instructions (Signed)
Continue taking home medications as prescribed. Continue taking symptomatic pain control for your back including ice, heat, Lidoderm. Follow-up with your pain management doctor if your pain continues to be severe. Follow-up with your primary care doctor if you have persistent nausea and vomiting. Return to the emergency room if you develop high fevers, severe abdominal pain, loss of bowel bladder control, new numbness of your legs, inability to walk, or any new, worsening, or concerning symptoms.

## 2020-09-20 NOTE — ED Notes (Signed)
ED Provider at bedside. 

## 2020-09-26 ENCOUNTER — Telehealth: Payer: Self-pay | Admitting: Physical Medicine & Rehabilitation

## 2020-09-26 NOTE — Telephone Encounter (Signed)
Patient called stated they were recently seen in by emergency medicine, and received a morphine shot. The provider that treated her stated she should see her pain management doctor prior to her October appt.  Please advise when we should schedule her.  Thank you

## 2020-09-26 NOTE — Telephone Encounter (Signed)
I'm not sure she needs to be seen if symptoms have improved.  If they have not, she can be scheduled with Dr. Adam Phenix.

## 2020-09-26 NOTE — Telephone Encounter (Signed)
Notified. 

## 2020-10-06 ENCOUNTER — Other Ambulatory Visit: Payer: Self-pay | Admitting: Physical Medicine & Rehabilitation

## 2020-10-15 ENCOUNTER — Other Ambulatory Visit: Payer: Self-pay | Admitting: Physical Medicine & Rehabilitation

## 2020-10-17 ENCOUNTER — Other Ambulatory Visit: Payer: Self-pay | Admitting: Physical Medicine & Rehabilitation

## 2020-10-31 ENCOUNTER — Telehealth: Payer: Self-pay | Admitting: Registered Nurse

## 2020-10-31 MED ORDER — GABAPENTIN 600 MG PO TABS: 600.0000 mg | ORAL_TABLET | Freq: Three times a day (TID) | ORAL | 0 refills | Status: AC

## 2020-10-31 NOTE — Addendum Note (Signed)
Addended by: Bayard Hugger on: 10/31/2020 02:07 PM   Modules accepted: Orders

## 2020-10-31 NOTE — Telephone Encounter (Signed)
[facility] sent rx to pt and it was lost in mail... they are asking 5-7 be called in locally to [address] in [place] the number is [phone] for Gabapentin  600mg 

## 2020-10-31 NOTE — Telephone Encounter (Signed)
Dr Posey Pronto note was reviewed, Gabapentin e-scribed today for a week supply.

## 2020-11-01 ENCOUNTER — Other Ambulatory Visit: Payer: Self-pay | Admitting: Physical Medicine & Rehabilitation

## 2020-11-27 ENCOUNTER — Other Ambulatory Visit: Payer: Self-pay | Admitting: Physical Medicine & Rehabilitation

## 2020-11-27 NOTE — Telephone Encounter (Signed)
PMP was Reviewed: Dr Posey Pronto note was reviewed.  Tramadol e-scribed today.

## 2021-01-10 ENCOUNTER — Other Ambulatory Visit: Payer: Self-pay | Admitting: Registered Nurse

## 2021-02-20 ENCOUNTER — Ambulatory Visit: Payer: Medicare HMO | Admitting: Registered Nurse

## 2021-05-24 ENCOUNTER — Other Ambulatory Visit: Payer: Self-pay | Admitting: Registered Nurse

## 2021-05-29 ENCOUNTER — Other Ambulatory Visit: Payer: Self-pay | Admitting: Registered Nurse

## 2021-08-11 ENCOUNTER — Other Ambulatory Visit: Payer: Self-pay | Admitting: Registered Nurse

## 2021-09-19 ENCOUNTER — Other Ambulatory Visit: Payer: Self-pay | Admitting: Registered Nurse
